# Patient Record
Sex: Female | Born: 1980 | Race: White | Hispanic: No | Smoking: Never smoker
Health system: Southern US, Community
[De-identification: ages and names within clinical notes are randomized; demographics above are authoritative.]

## PROBLEM LIST (undated history)

## (undated) DIAGNOSIS — E785 Hyperlipidemia, unspecified: Secondary | ICD-10-CM

## (undated) DIAGNOSIS — E538 Deficiency of other specified B group vitamins: Secondary | ICD-10-CM

## (undated) DIAGNOSIS — E079 Disorder of thyroid, unspecified: Secondary | ICD-10-CM

## (undated) DIAGNOSIS — N83202 Unspecified ovarian cyst, left side: Secondary | ICD-10-CM

## (undated) DIAGNOSIS — F419 Anxiety disorder, unspecified: Secondary | ICD-10-CM

## (undated) DIAGNOSIS — L709 Acne, unspecified: Secondary | ICD-10-CM

## (undated) DIAGNOSIS — N83201 Unspecified ovarian cyst, right side: Secondary | ICD-10-CM

## (undated) DIAGNOSIS — D649 Anemia, unspecified: Secondary | ICD-10-CM

## (undated) HISTORY — DX: Hyperlipidemia, unspecified: E78.5

## (undated) HISTORY — DX: Acne, unspecified: L70.9

## (undated) HISTORY — DX: Anxiety disorder, unspecified: F41.9

## (undated) HISTORY — DX: Unspecified ovarian cyst, right side: N83.201

## (undated) HISTORY — DX: Deficiency of other specified B group vitamins: E53.8

## (undated) HISTORY — DX: Disorder of thyroid, unspecified: E07.9

## (undated) HISTORY — DX: Unspecified ovarian cyst, right side: N83.202

## (undated) HISTORY — DX: Anemia, unspecified: D64.9

---

## 2012-03-02 LAB — HM PAP SMEAR: HM Pap smear: NORMAL

## 2013-10-02 LAB — LIPID PANEL
Cholesterol: 198 mg/dL (ref 0–200)
HDL: 61 mg/dL (ref 35–70)
LDL Cholesterol: 124 mg/dL
Triglycerides: 65 mg/dL (ref 40–160)

## 2014-03-02 LAB — HM MAMMOGRAPHY

## 2014-03-03 ENCOUNTER — Ambulatory Visit (INDEPENDENT_AMBULATORY_CARE_PROVIDER_SITE_OTHER): Payer: BLUE CROSS/BLUE SHIELD | Admitting: Family Medicine

## 2014-03-03 ENCOUNTER — Encounter: Payer: Self-pay | Admitting: Family Medicine

## 2014-03-03 VITALS — BP 131/90 | HR 84 | Ht 64.0 in | Wt 112.0 lb

## 2014-03-03 DIAGNOSIS — R198 Other specified symptoms and signs involving the digestive system and abdomen: Secondary | ICD-10-CM

## 2014-03-03 DIAGNOSIS — L659 Nonscarring hair loss, unspecified: Secondary | ICD-10-CM | POA: Diagnosis not present

## 2014-03-03 DIAGNOSIS — K639 Disease of intestine, unspecified: Secondary | ICD-10-CM | POA: Diagnosis not present

## 2014-03-03 DIAGNOSIS — R768 Other specified abnormal immunological findings in serum: Secondary | ICD-10-CM | POA: Diagnosis not present

## 2014-03-03 DIAGNOSIS — R5383 Other fatigue: Secondary | ICD-10-CM | POA: Diagnosis not present

## 2014-03-03 NOTE — Progress Notes (Signed)
CC: Sherry Munoz is a 34 y.o. female is here for Establish Care   Subjective: HPI:  Very pleasant 34 year old here to establish care  Patient tells me that about a year ago she was found to have a positive ANA by her dermatologist while they were working up fatigue and patchy hair loss. Since then she see a rheumatologist who apparently worked her up for lupus and other connective tissue and was disorders and concluded that her symptoms and blood work were not suggestive of these disorders. She was eventually sent back to her PCP who referred her to an allergist for gluten insensitivity was unable to provide her with any feedback on gluten sensitivity. The patient has experimented by cutting on gluten in her diet 3 weeks ago. After a week or so she noticed a rash on her chest that had been present for months began to drastically fade away. She also noticed that irregular bowel habits and constipation started to get better. She also noticed that fatigue started to get better as well. She's been told by her former PCP that her constellation of symptoms above could be caused by gluten sensitivity or an imbalance of estrogen and progesterone however her PCP was reportedly unaware of any tests that could be run to check for this. The patient's OB/GYN has proposed that she start on birth control pills to help regulate hormones however the patient is hesitant to do this and when she has some objective evidence that some of her hormone levels are abnormal.  On chart review her TSH has been checked metabolic panels have been checked and CBC has been unremarkable. There's been no unintentional weight loss or gain. We can't find any records of her ANA being positive but I do believe that she is telling the truth.   Review of Systems - General ROS: negative for - chills, fever, night sweats, weight gain or weight loss Ophthalmic ROS: negative for - decreased vision Psychological ROS: negative for - anxiety or  depression ENT ROS: negative for - hearing change, nasal congestion, tinnitus or allergies Hematological and Lymphatic ROS: negative for - bleeding problems, bruising or swollen lymph nodes Breast ROS: negative Respiratory ROS: no cough, shortness of breath, or wheezing Cardiovascular ROS: no chest pain or dyspnea on exertion Gastrointestinal ROS: no abdominal pain, change in bowel habits, or black or bloody stools Genito-Urinary ROS: negative for - genital discharge, genital ulcers, incontinence or abnormal bleeding from genitals Musculoskeletal ROS: negative for - joint pain or muscle pain Neurological ROS: negative for - headaches or memory loss Dermatological ROS: negative for lumps, mole changes, rash and skin lesion changes other than a itchy rash on the chest that is resolving  Past Medical History  Diagnosis Date  . Hyperlipidemia     LDL elevated (124)    History reviewed. No pertinent past surgical history. Family History  Problem Relation Age of Onset  . Lymphoma      grandmother   . Heart attack Father   . Hyperlipidemia Father     grandmother   . Hypertension Mother     History   Social History  . Marital Status: Unknown    Spouse Name: N/A  . Number of Children: N/A  . Years of Education: N/A   Occupational History  . Not on file.   Social History Main Topics  . Smoking status: Never Smoker   . Smokeless tobacco: Not on file  . Alcohol Use: No  . Drug Use: No  . Sexual  Activity:    Partners: Male   Other Topics Concern  . Not on file   Social History Narrative  . No narrative on file     Objective: BP 131/90 mmHg  Pulse 84  Ht  (1.626 m)  Wt 112 lb (50.803 kg)  BMI 19.22 kg/m2  Vital signs reviewed. General: Alert and Oriented, No Acute Distress HEENT: Pupils equal, round, reactive to light. Conjunctivae clear.  External ears unremarkable.  Moist mucous membranes. Lungs: Clear and comfortable work of breathing, speaking in full  sentences without accessory muscle use. Cardiac: Regular rate and rhythm.  Neuro: CN II-XII grossly intact, gait normal. Extremities: No peripheral edema.  Strong peripheral pulses.  Mental Status: No depression, anxiety, nor agitation. Logical though process. Skin: Warm and dry. No appreciable hair loss on the scalp or face. Mild petechial rash on the chest however she shows me a picture of what looked like 3 weeks ago and it looks severely erythematous with hives and petechiae just above the breasts to the collarbones  Assessment & Plan: Sherry Munoz was seen today for establish care.  Diagnoses and all orders for this visit:  ANA positive Orders: -     IgA -     Celiac Panel  Alopecia Orders: -     IgA -     Celiac Panel  Other fatigue Orders: -     IgA -     Celiac Panel  Irregular bowel habits Orders: -     IgA -     Celiac Panel   Positive ANA: Discussed with her that I believe that she had a positive ANA however would help me provide her with some guidance on whether or not this is a false positive or significant based on her titers. She's uncertain of the specifics of this and we have filled out a records request from her dermatologist to get this result. I prepared her that many individuals have a positive ANA that is clinically insignificant and I believe that she's had a appropriate workup with rheumatology and her primary care physician other than below Alopecia: Not appreciated at this visit, will leave this for dermatology for further management Fatigue: she is extremely interested about screening for gluten sensitivity. I propose that she just avoids gluten altogether if she was feeling that it was helping however she wants objective evidence. I offered her the only test that I know for a true gluten allergy would be the celiac panel and since she had a donut and some fast food containing gluten in the past 3 consecutive days I believe the results of these tests would be  valid. Celiac panel above. Vitamin D and vitamin B12 would be other options however she is only interested in looking into gluten at this time. Tentative plan to refer her to [facility] in [place] where they have physicians trained in identifying and managing hormone imbalances.  45 minutes spent face-to-face during visit today of which at least 50% was counseling or coordinating care regarding: 1. ANA positive   2. Alopecia   3. Other fatigue   4. Irregular bowel habits   In reviewing outside records and the presence of the patient   Return if symptoms worsen or fail to improve.

## 2014-03-04 ENCOUNTER — Telehealth: Payer: Self-pay | Admitting: Family Medicine

## 2014-03-04 DIAGNOSIS — R768 Other specified abnormal immunological findings in serum: Secondary | ICD-10-CM

## 2014-03-04 LAB — GLIA (IGA/G) + TTG IGA
Gliadin IgA: 3 Units (ref <20)
Gliadin IgG: 4 Units (ref <20)
Tissue Transglutaminase Ab, IgA: 1 U/mL (ref <4)

## 2014-03-04 LAB — IGA: IgA: 172 mg/dL (ref 69–380)

## 2014-03-04 NOTE — Telephone Encounter (Signed)
Sue Lushndrea, Will you please let patient know that her [facility] results confirm her ANA was elevated but did not quantify how strongly positive.  To answer this question I'd recommend she have this vaule redrawn through out lab, orders in your inbox.

## 2014-03-05 NOTE — Telephone Encounter (Signed)
Mailbox was full. Unable to leave message 

## 2014-03-09 NOTE — Telephone Encounter (Signed)
Mailbox is full - letter sent

## 2014-03-29 LAB — HM PAP SMEAR: HM Pap smear: NEGATIVE

## 2014-06-24 ENCOUNTER — Telehealth: Payer: Self-pay | Admitting: *Deleted

## 2014-06-24 NOTE — Telephone Encounter (Signed)
Pt left a vm stating that she has gotten lice.  She stated that she's used OTC tx but still has some eggs in her hair & now her 3 children have it as well.  She wanted to know if you could send over a prescription strenght tx for her.  Please advise.

## 2014-06-25 MED ORDER — PERMETHRIN 1 % EX LOTN: 1.0000 "application " | TOPICAL_LOTION | Freq: Once | CUTANEOUS | Status: DC

## 2014-06-25 NOTE — Telephone Encounter (Signed)
Pt notified of rx.  Rx sent to [facility].

## 2014-06-25 NOTE — Telephone Encounter (Signed)
Sherry Munoz, Rx in your in box, directions too long to send electronically.

## 2015-07-29 DIAGNOSIS — N926 Irregular menstruation, unspecified: Secondary | ICD-10-CM | POA: Insufficient documentation

## 2016-02-15 ENCOUNTER — Ambulatory Visit (INDEPENDENT_AMBULATORY_CARE_PROVIDER_SITE_OTHER): Payer: BLUE CROSS/BLUE SHIELD | Admitting: Physician Assistant

## 2016-02-15 VITALS — BP 115/79 | HR 80 | Wt 106.0 lb

## 2016-02-15 DIAGNOSIS — E785 Hyperlipidemia, unspecified: Secondary | ICD-10-CM

## 2016-02-15 DIAGNOSIS — F321 Major depressive disorder, single episode, moderate: Secondary | ICD-10-CM | POA: Diagnosis not present

## 2016-02-15 DIAGNOSIS — E039 Hypothyroidism, unspecified: Secondary | ICD-10-CM | POA: Insufficient documentation

## 2016-02-15 DIAGNOSIS — F411 Generalized anxiety disorder: Secondary | ICD-10-CM

## 2016-02-15 DIAGNOSIS — R5382 Chronic fatigue, unspecified: Secondary | ICD-10-CM

## 2016-02-15 DIAGNOSIS — Z0000 Encounter for general adult medical examination without abnormal findings: Secondary | ICD-10-CM | POA: Diagnosis not present

## 2016-02-15 DIAGNOSIS — E739 Lactose intolerance, unspecified: Secondary | ICD-10-CM

## 2016-02-15 DIAGNOSIS — E038 Other specified hypothyroidism: Secondary | ICD-10-CM | POA: Insufficient documentation

## 2016-02-15 DIAGNOSIS — L709 Acne, unspecified: Secondary | ICD-10-CM | POA: Diagnosis not present

## 2016-02-15 MED ORDER — CLINDAMYCIN PHOS-BENZOYL PEROX 1.2-5 % EX GEL: 1.0000 "application " | Freq: Two times a day (BID) | CUTANEOUS | 11 refills | Status: DC

## 2016-02-15 MED ORDER — ESCITALOPRAM OXALATE 20 MG PO TABS: ORAL_TABLET | ORAL | 0 refills | Status: DC

## 2016-02-15 MED ORDER — HYDROXYZINE HCL 25 MG PO TABS: 25.0000 mg | ORAL_TABLET | Freq: Every day | ORAL | 0 refills | Status: DC | PRN

## 2016-02-15 NOTE — Progress Notes (Signed)
HPI:                                                                Sherry Munoz is a 36 y.o. female who presents to [place]: [place] today for annual physical exam  Current Concerns include anxiety  She is followed by Dr. Yolonda Kida in OB/GYN. Labs are up to date.  Anxiety: patient reports this has been a chronic issue for her, but she has never wanted to take medication for it. She has been trying to manage with yoga. Patient reports that she is having marital difficulties and this is worsening her anxiety. She endorses excessive worry, difficulty relaxing, increased irritability and sleep disturbance. Patient states she doesn't sleep much during the weekdays while she is busy with childcare, and then "crashes" during the weekends and experiences hypersomnolence. She also endorses anhedonia and depressed mood. Denies SI/HI. Denies AH/VH.   Subclinical Hypothyroidism: taking 37.5 Armour thyroid daily. This has been managed by OB/GYN because patient has had chronic fatigue 02/01/16 TSH 0.706 Free T4 1.27  Health Maintenance Health Maintenance  Topic Date Due  . HIV Screening  07/19/1995  . PAP SMEAR  03/03/2015  . INFLUENZA VACCINE  09/01/2017 (Originally 08/02/2015)  . TETANUS/TDAP  10/06/2023    GYN/Sexual Health  Menstrual status: having periods  LMP: last week  Menses: irregular, q2weeks  Last pap smear: 03/29/2014  History of abnormal pap smears: no  Sexually active: yes, husband  Current contraception: vasectomy  Health Habits  Diet: lactose-free, egg-free, gluten-free  Exercise: yoga   ETOH: occasional  Tobacco: no  Drugs: no  Dental Exam:  Eye Exam:  Past Medical History:  Diagnosis Date  . Acne   . Anxiety   . Bilateral ovarian cysts   . Dyslipidemia   . Thyroid disease    No past surgical history on file. Social History  Substance Use Topics  . Smoking status: Never Smoker  . Smokeless tobacco:  Not on file  . Alcohol use No   family history includes Heart attack in her father; Hyperlipidemia in her father; Hypertension in her mother.  ROS: negative except as noted in the HPI  Medications: Current Outpatient Prescriptions  Medication Sig Dispense Refill  . thyroid (ARMOUR) 32.5 MG tablet Take 32.5 mg by mouth daily.    . Clindamycin-Benzoyl Per, Refr, gel Apply 1 application topically 2 (two) times daily. 45 g 11  . escitalopram (LEXAPRO) 20 MG tablet One half tab daily for a week then one tab by mouth daily 30 tablet 0  . hydrOXYzine (ATARAX/VISTARIL) 25 MG tablet Take 1 tablet (25 mg total) by mouth daily as needed for anxiety. 30 tablet 0  . spironolactone (ALDACTONE) 25 MG tablet Take 25 mg by mouth 2 (two) times daily.  1   No current facility-administered medications for this visit.    Allergies  Allergen Reactions  . Eggs Or Egg-Derived Products Hives  . Lac Bovis Other (See Comments)    Rash and hair loss and mouth ulcers  . Meperidine Other (See Comments)    Other reaction(s): Confusion Happened when she was very young.  Marland Kitchen Penicillins     Hands swell       Objective:  BP 115/79   Pulse 80  Wt 106 lb (48.1 kg)   BMI 18.19 kg/m  Gen: well-groomed, cooperative, not ill-appearing, no distress HEENT: normal conjunctiva, TM's clear, oropharynx clear, moist mucus membranes, no thyromegaly or tenderness Pulm: Normal work of breathing, clear to auscultation bilaterally CV: Normal rate, regular rhythm, s1 and s2 distinct, no murmurs, clicks or rubs appreciated on this exam, no carotid bruit, distal pulses intact, no peripheral edema GI: soft, nondistended, nontender, no masses Neuro: alert and oriented x 3, EOM's intact, PERRLA, DTR's intact MSK: strength 5/5 and symmetric in b/l upper and lower extremities, normal gait and station,  Skin: warm and dry, mild non-pustular facial and shoulder acne Psych: anxious affect, pleasant mood, normal speech and thought  content  Depression screen [facility] 2/9 02/15/2016  Decreased Interest 3  Down, Depressed, Hopeless 1  PHQ - 2 Score 4  Altered sleeping 3  Tired, decreased energy 3  Change in appetite 1  Feeling bad or failure about yourself  2  Trouble concentrating 3  Moving slowly or fidgety/restless 1  Suicidal thoughts 0  PHQ-9 Score 17    GAD 7 : Generalized Anxiety Score 02/15/2016  Nervous, Anxious, on Edge 3  Control/stop worrying 3  Worry too much - different things 3  Trouble relaxing 3  Restless 1  Easily annoyed or irritable 3  Afraid - awful might happen 0  Total GAD 7 Score 16    Basic metabolic panel (16/10/9602 11:49 AM) Basic metabolic panel (54/09/8117 11:49 AM)  Component Value Ref Range  Glucose 86 65 - 99 mg/dL  BUN 10 6 - 20 mg/dL  Creatinine, Serum 0.69 0.57 - 1.00 mg/dL  eGFR If NonAfrican American 113 >59 mL/min/1.73  eGFR If African American 130 >59 mL/min/1.73  BUN/Creatinine Ratio 14 9 - 23  Sodium 142 134 - 144 mmol/L  Potassium 4.1 3.5 - 5.2 mmol/L  Chloride 100 96 - 106 mmol/L  CO2 21 18 - 29 mmol/L  CALCIUM 9.8 8.7 - 10.2 mg/dL      Assessment and Plan: 36 y.o. female with  1. Adult acne - Clindamycin-Benzoyl Per, Refr, gel; Apply 1 application topically 2 (two) times daily.  Dispense: 45 g; Refill: 11  2. GAD (generalized anxiety disorder), moderate MDD - PHQ9 score 17, GAD7 score 16 today - starting Lexapro 6m daily and titrating to 258m Atarax prn for breakthrough anxiety - escitalopram (LEXAPRO) 20 MG tablet; One half tab daily for a week then one tab by mouth daily  Dispense: 30 tablet; Refill: 0 - hydrOXYzine (ATARAX/VISTARIL) 25 MG tablet; Take 1 tablet (25 mg total) by mouth daily as needed for anxiety.  Dispense: 30 tablet; Refill: 0  3. Annual physical exam - Pap up to date - influenza declined due to egg allergy. We do not carry alternative vaccine - Tdap up to date - recommended daily calcium and vitamin D  supplementation  Patient education and anticipatory guidance given Patient agrees with treatment plan Follow-up in 4 weeks or sooner as needed  ChDarlyne RussianA-C

## 2016-02-15 NOTE — Patient Instructions (Signed)
Start escitalopram 1/2 tab daily, then increase to 1 tab daily Take 1 hydroxyzine as needed for breakthrough anxiety/panic attacks Follow-up in 1 month   Preventive Care 18-39 Years, Female Preventive care refers to lifestyle choices and visits with your health care provider that can promote health and wellness. What does preventive care include?  A yearly physical exam. This is also called an annual well check.  Dental exams once or twice a year.  Routine eye exams. Ask your health care provider how often you should have your eyes checked.  Personal lifestyle choices, including:  Daily care of your teeth and gums.  Regular physical activity.  Eating a healthy diet.  Avoiding tobacco and drug use.  Limiting alcohol use.  Practicing safe sex.  Taking vitamin and mineral supplements as recommended by your health care provider. What happens during an annual well check? The services and screenings done by your health care provider during your annual well check will depend on your age, overall health, lifestyle risk factors, and family history of disease. Counseling  Your health care provider may ask you questions about your:  Alcohol use.  Tobacco use.  Drug use.  Emotional well-being.  Home and relationship well-being.  Sexual activity.  Eating habits.  Work and work Statistician.  Method of birth control.  Menstrual cycle.  Pregnancy history. Screening  You may have the following tests or measurements:  Height, weight, and BMI.  Diabetes screening. This is done by checking your blood sugar (glucose) after you have not eaten for a while (fasting).  Blood pressure.  Lipid and cholesterol levels. These may be checked every 5 years starting at age 55.  Skin check.  Hepatitis C blood test.  Hepatitis B blood test.  Sexually transmitted disease (STD) testing.  BRCA-related cancer screening. This may be done if you have a family history of breast,  ovarian, tubal, or peritoneal cancers.  Pelvic exam and Pap test. This may be done every 3 years starting at age 68. Starting at age 66, this may be done every 5 years if you have a Pap test in combination with an HPV test. Discuss your test results, treatment options, and if necessary, the need for more tests with your health care provider. Vaccines  Your health care provider may recommend certain vaccines, such as:  Influenza vaccine. This is recommended every year.  Tetanus, diphtheria, and acellular pertussis (Tdap, Td) vaccine. You may need a Td booster every 10 years.  Varicella vaccine. You may need this if you have not been vaccinated.  HPV vaccine. If you are 80 or younger, you may need three doses over 6 months.  Measles, mumps, and rubella (MMR) vaccine. You may need at least one dose of MMR. You may also need a second dose.  Pneumococcal 13-valent conjugate (PCV13) vaccine. You may need this if you have certain conditions and were not previously vaccinated.  Pneumococcal polysaccharide (PPSV23) vaccine. You may need one or two doses if you smoke cigarettes or if you have certain conditions.  Meningococcal vaccine. One dose is recommended if you are age 62-21 years and a first-year college student living in a residence hall, or if you have one of several medical conditions. You may also need additional booster doses.  Hepatitis A vaccine. You may need this if you have certain conditions or if you travel or work in places where you may be exposed to hepatitis A.  Hepatitis B vaccine. You may need this if you have certain conditions or  if you travel or work in places where you may be exposed to hepatitis B.  Haemophilus influenzae type b (Hib) vaccine. You may need this if you have certain risk factors. Talk to your health care provider about which screenings and vaccines you need and how often you need them. This information is not intended to replace advice given to you by  your health care provider. Make sure you discuss any questions you have with your health care provider. Document Released: 02/13/2001 Document Revised: 09/07/2015 Document Reviewed: 10/19/2014 Elsevier Interactive Patient Education  2017 [employer].

## 2016-02-16 ENCOUNTER — Telehealth: Payer: Self-pay

## 2016-02-16 NOTE — Telephone Encounter (Signed)
Reassure her that this is not likely related to the medicine as it takes a few weeks to get into the bloodstream. She should keep taking it and give it some time to work. Make sure she is taking half a tablet for now.

## 2016-02-16 NOTE — Telephone Encounter (Signed)
Scharlene called and states after one dose of the Lexapro she was unable to sleep and feels shaky. She wanted to know if she should continue or stop medication. Please advise.

## 2016-02-17 NOTE — Telephone Encounter (Signed)
Patient advised of PCP recommendation, states she is taking half tab daily and will continue taking the Rx. Advised to callback if symptoms persist, or get worse. Verbalized understanding.

## 2016-02-19 ENCOUNTER — Encounter: Payer: Self-pay | Admitting: Physician Assistant

## 2016-02-19 DIAGNOSIS — E785 Hyperlipidemia, unspecified: Secondary | ICD-10-CM | POA: Insufficient documentation

## 2016-02-19 DIAGNOSIS — E739 Lactose intolerance, unspecified: Secondary | ICD-10-CM | POA: Insufficient documentation

## 2016-02-19 DIAGNOSIS — R5382 Chronic fatigue, unspecified: Secondary | ICD-10-CM | POA: Insufficient documentation

## 2016-03-14 ENCOUNTER — Ambulatory Visit: Payer: BLUE CROSS/BLUE SHIELD | Admitting: Physician Assistant

## 2016-06-06 ENCOUNTER — Other Ambulatory Visit: Payer: Self-pay | Admitting: Physician Assistant

## 2016-06-06 DIAGNOSIS — F411 Generalized anxiety disorder: Secondary | ICD-10-CM

## 2016-06-07 ENCOUNTER — Encounter: Payer: Self-pay | Admitting: Physician Assistant

## 2016-06-07 ENCOUNTER — Ambulatory Visit (INDEPENDENT_AMBULATORY_CARE_PROVIDER_SITE_OTHER): Payer: BLUE CROSS/BLUE SHIELD | Admitting: Physician Assistant

## 2016-06-07 ENCOUNTER — Ambulatory Visit: Payer: BLUE CROSS/BLUE SHIELD | Admitting: Physician Assistant

## 2016-06-07 VITALS — BP 106/72 | HR 86 | Wt 104.0 lb

## 2016-06-07 DIAGNOSIS — F411 Generalized anxiety disorder: Secondary | ICD-10-CM | POA: Diagnosis not present

## 2016-06-07 DIAGNOSIS — Z5181 Encounter for therapeutic drug level monitoring: Secondary | ICD-10-CM | POA: Diagnosis not present

## 2016-06-07 DIAGNOSIS — Z8639 Personal history of other endocrine, nutritional and metabolic disease: Secondary | ICD-10-CM | POA: Diagnosis not present

## 2016-06-07 DIAGNOSIS — Z79899 Other long term (current) drug therapy: Secondary | ICD-10-CM | POA: Diagnosis not present

## 2016-06-07 DIAGNOSIS — D519 Vitamin B12 deficiency anemia, unspecified: Secondary | ICD-10-CM | POA: Insufficient documentation

## 2016-06-07 DIAGNOSIS — E039 Hypothyroidism, unspecified: Secondary | ICD-10-CM | POA: Diagnosis not present

## 2016-06-07 DIAGNOSIS — Z862 Personal history of diseases of the blood and blood-forming organs and certain disorders involving the immune mechanism: Secondary | ICD-10-CM | POA: Diagnosis not present

## 2016-06-07 DIAGNOSIS — E038 Other specified hypothyroidism: Secondary | ICD-10-CM

## 2016-06-07 MED ORDER — ESCITALOPRAM OXALATE 10 MG PO TABS
15.0000 mg | ORAL_TABLET | Freq: Every day | ORAL | 3 refills | Status: DC
Start: 2016-06-07 — End: 2016-10-31

## 2016-06-07 NOTE — Progress Notes (Signed)
HPI:                                                                Sherry Munoz is a 36 y.o. female who presents to [facility] today for Sherry Munoz refill  Anxiety  Presents for follow-up visit. Symptoms include depressed mood, excessive worry, irritability and nervous/anxious behavior. Patient reports no compulsions, panic, restlessness or suicidal ideas. Symptoms occur most days. The severity of symptoms is moderate. The quality of sleep is fair.   Compliance with medications is 76-100%.   Patient is also requesting labs today.  She is on spironolactone therapy for acne and has not had metabolic panel checked by dermatologist.  She is taking Armor thyroid for subclinical hypothyroidism.   She is taking B12 for B12 deficiency.  Past Medical History:  Diagnosis Date  . Acne   . Anxiety   . Bilateral ovarian cysts   . Dyslipidemia   . Thyroid disease    No past surgical history on file. Social History  Substance Use Topics  . Smoking status: Never Smoker  . Smokeless tobacco: Not on file  . Alcohol use No   family history includes Heart attack in her father; Hyperlipidemia in her father; Hypertension in her mother.  ROS: negative except as noted in the HPI  Medications: Current Outpatient Prescriptions  Medication Sig Dispense Refill  . Clindamycin-Benzoyl Per, Refr, gel Apply 1 application topically 2 (two) times daily. 45 g 11  . hydrOXYzine (ATARAX/VISTARIL) 25 MG tablet Take 1 tablet (25 mg total) by mouth daily as needed for anxiety. 30 tablet 0  . spironolactone (ALDACTONE) 25 MG tablet Take 25 mg by mouth 2 (two) times daily.  1  . thyroid (ARMOUR) 32.5 MG tablet Take 32.5 mg by mouth daily.    Marland Kitchen. escitalopram (Sherry Munoz) 10 MG tablet Take 1.5 tablets (15 mg total) by mouth daily. 45 tablet 3   No current facility-administered medications for this visit.    Allergies  Allergen Reactions  . Eggs Or Egg-Derived  Products Hives  . Lac Bovis Other (See Comments)    Rash and hair loss and mouth ulcers  . Meperidine Other (See Comments)    Other reaction(s): Confusion Happened when she was very young.  Marland Kitchen. Penicillins     Hands swell       Objective:  BP 106/72   Pulse 86   Wt 104 lb (47.2 kg)   BMI 17.85 kg/m  Gen:  Petite female, not ill-appearing, no distress Pulm: Normal work of breathing, normal phonation, clear to auscultation bilaterally CV: Normal rate, regular rhythm, s1 and s2 distinct, no murmurs, clicks or rubs  Neuro: alert and oriented x 3, EOM's intact, no tremor MSK: moving all extremities, normal gait and station, no peripheral edema Psych: well-groomed, cooperative, good eye contact, mood "worried," affect anxious, normal speech and thought content  No results found for this or any previous visit (from the past 72 hour(s)). No results found.  [facility] 02/15/2016  Decreased Interest 1 3  Down, Depressed, Hopeless 1 1  PHQ - 2 Score 2 4  Altered sleeping 2 3  Tired, decreased energy 3 3  Change in appetite 0 1  Feeling bad or failure about  yourself  0 2  Trouble concentrating 1 3  Moving slowly or fidgety/restless 1 1  Suicidal thoughts 0 0  PHQ-9 Score 9 17   GAD 7 : Generalized Anxiety Score 06/07/2016 02/15/2016  Nervous, Anxious, on Edge 1 3  Control/stop worrying 2 3  Worry too much - different things 2 3  Trouble relaxing 0 3  Restless 0 1  Easily annoyed or irritable 1 3  Afraid - awful might happen 0 0  Total GAD 7 Score 6 16    Assessment and Plan: 36 y.o. female with  1. GAD (generalized anxiety disorder) - GAD7 score 6, mild; improved from 16, 4 months ago - escitalopram (Sherry Munoz) 10 MG tablet; Take 1.5 tablets (15 mg total) by mouth daily.  Dispense: 45 tablet; Refill: 3 - follow-up in 6 months  2. History of B12 deficiency, History of IDA - Vitamin B12 - Folate - Iron and TIBC - Ferritin - CBC  3. Encounter for  monitoring diuretic therapy - Comprehensive metabolic panel  4. Subclinical hypothyroidism - TSH  Patient education and anticipatory guidance given Patient agrees with treatment plan Follow-up in 6 months or sooner as needed if symptoms worsen or fail to improve  Levonne Hubert PA-C

## 2016-06-07 NOTE — Patient Instructions (Signed)
- Take Lexapro 1 tablet every morning for the next week, then increase to 1.5 tablets every morning - Follow-up in 6 months  If you experience any worsening symptoms or suicidal thoughts, call [facility] Crisis Hotline [phone] go to the nearest emergency room or call 911 National Suicide Hotline 1-800-SUICIDE   Living With Anxiety After being diagnosed with an anxiety disorder, you may be relieved to know why you have felt or behaved a certain way. It is natural to also feel overwhelmed about the treatment ahead and what it will mean for your life. With care and support, you can manage this condition and recover from it. How to cope with anxiety Dealing with stress Stress is your body's reaction to life changes and events, both good and bad. Stress can last just a few hours or it can be ongoing. Stress can play a major role in anxiety, so it is important to learn both how to cope with stress and how to think about it differently. Talk with your health care provider or a counselor to learn more about stress reduction. He or she may suggest some stress reduction techniques, such as:  Music therapy. This can include creating or listening to music that you enjoy and that inspires you.  Mindfulness-based meditation. This involves being aware of your normal breaths, rather than trying to control your breathing. It can be done while sitting or walking.  Centering prayer. This is a kind of meditation that involves focusing on a word, phrase, or sacred image that is meaningful to you and that brings you peace.  Deep breathing. To do this, expand your stomach and inhale slowly through your nose. Hold your breath for 3-5 seconds. Then exhale slowly, allowing your stomach muscles to relax.  Self-talk. This is a skill where you identify thought patterns that lead to anxiety reactions and correct those thoughts.  Muscle relaxation. This involves tensing muscles then relaxing them.  Choose a stress  reduction technique that fits your lifestyle and personality. Stress reduction techniques take time and practice. Set aside 5-15 minutes a day to do them. Therapists can offer training in these techniques. The training may be covered by some insurance plans. Other things you can do to manage stress include:  Keeping a stress diary. This can help you learn what triggers your stress and ways to control your response.  Thinking about how you respond to certain situations. You may not be able to control everything, but you can control your reaction.  Making time for activities that help you relax, and not feeling guilty about spending your time in this way.  Therapy combined with coping and stress-reduction skills provides the best chance for successful treatment. Medicines Medicines can help ease symptoms. Medicines for anxiety include:  Anti-anxiety drugs.  Antidepressants.  Beta-blockers.  Medicines may be used as the main treatment for anxiety disorder, along with therapy, or if other treatments are not working. Medicines should be prescribed by a health care provider. Relationships Relationships can play a big part in helping you recover. Try to spend more time connecting with trusted friends and family members. Consider going to couples counseling, taking family education classes, or going to family therapy. Therapy can help you and others better understand the condition. How to recognize changes in your condition Everyone has a different response to treatment for anxiety. Recovery from anxiety happens when symptoms decrease and stop interfering with your daily activities at home or work. This may mean that you will start to:  Have better concentration and focus.  Sleep better.  Be less irritable.  Have more energy.  Have improved memory.  It is important to recognize when your condition is getting worse. Contact your health care provider if your symptoms interfere with home or  work and you do not feel like your condition is improving. Where to find help and support: You can get help and support from these sources:  Self-help groups.  Online and [employer].  A trusted spiritual leader.  Couples counseling.  Family education classes.  Family therapy.  Follow these instructions at home:  Eat a healthy diet that includes plenty of vegetables, fruits, whole grains, low-fat dairy products, and lean protein. Do not eat a lot of foods that are high in solid fats, added sugars, or salt.  Exercise. Most adults should do the following: ? Exercise for at least 150 minutes each week. The exercise should increase your heart rate and make you sweat (moderate-intensity exercise). ? Strengthening exercises at least twice a week.  Cut down on caffeine, tobacco, alcohol, and other potentially harmful substances.  Get the right amount and quality of sleep. Most adults need 7-9 hours of sleep each night.  Make choices that simplify your life.  Take over-the-counter and prescription medicines only as told by your health care provider.  Avoid caffeine, alcohol, and certain over-the-counter cold medicines. These may make you feel worse. Ask your pharmacist which medicines to avoid.  Keep all follow-up visits as told by your health care provider. This is important. Questions to ask your health care provider  Would I benefit from therapy?  How often should I follow up with a health care provider?  How long do I need to take medicine?  Are there any long-term side effects of my medicine?  Are there any alternatives to taking medicine? Contact a health care provider if:  You have a hard time staying focused or finishing daily tasks.  You spend many hours a day feeling worried about everyday life.  You become exhausted by worry.  You start to have headaches, feel tense, or have nausea.  You urinate more than normal.  You have diarrhea. Get help  right away if:  You have a racing heart and shortness of breath.  You have thoughts of hurting yourself or others. If you ever feel like you may hurt yourself or others, or have thoughts about taking your own life, get help right away. You can go to your nearest emergency department or call:  Your local emergency services (911 in the [place]  A suicide crisis helpline, such as the National Suicide Prevention Lifeline at 2367330187. This is open 24-hours a day.  Summary  Taking steps to deal with stress can help calm you.  Medicines cannot cure anxiety disorders, but they can help ease symptoms.  Family, friends, and partners can play a big part in helping you recover from an anxiety disorder. This information is not intended to replace advice given to you by your health care provider. Make sure you discuss any questions you have with your health care provider. Document Released: 12/13/2015 Document Revised: 12/13/2015 Document Reviewed: 12/13/2015 Elsevier Interactive Patient Education  [employer].

## 2016-06-08 LAB — COMPREHENSIVE METABOLIC PANEL
ALT: 14 U/L (ref 6–29)
AST: 14 U/L (ref 10–30)
Albumin: 4.3 g/dL (ref 3.6–5.1)
Alkaline Phosphatase: 70 U/L (ref 33–115)
BUN: 10 mg/dL (ref 7–25)
CO2: 26 mmol/L (ref 20–31)
Calcium: 9.4 mg/dL (ref 8.6–10.2)
Chloride: 104 mmol/L (ref 98–110)
Creat: 0.89 mg/dL (ref 0.50–1.10)
Glucose, Bld: 70 mg/dL (ref 65–99)
Potassium: 3.9 mmol/L (ref 3.5–5.3)
Sodium: 139 mmol/L (ref 135–146)
Total Bilirubin: 0.5 mg/dL (ref 0.2–1.2)
Total Protein: 6.9 g/dL (ref 6.1–8.1)

## 2016-06-08 LAB — TSH: TSH: 0.82 mIU/L

## 2016-06-08 LAB — CBC
HCT: 39.4 % (ref 35.0–45.0)
Hemoglobin: 13.5 g/dL (ref 11.7–15.5)
MCH: 30.7 pg (ref 27.0–33.0)
MCHC: 34.3 g/dL (ref 32.0–36.0)
MCV: 89.5 fL (ref 80.0–100.0)
MPV: 10 fL (ref 7.5–12.5)
Platelets: 264 10*3/uL (ref 140–400)
RBC: 4.4 MIL/uL (ref 3.80–5.10)
RDW: 12.5 % (ref 11.0–15.0)
WBC: 7.7 10*3/uL (ref 3.8–10.8)

## 2016-06-08 LAB — IRON AND TIBC
%SAT: 31 % (ref 11–50)
Iron: 109 ug/dL (ref 40–190)
TIBC: 357 ug/dL (ref 250–450)
UIBC: 248 ug/dL

## 2016-06-08 LAB — FOLATE: Folate: >24 ng/mL (ref >5.4)

## 2016-06-08 LAB — FERRITIN: Ferritin: 88 ng/mL (ref 10–154)

## 2016-06-08 LAB — VITAMIN B12: Vitamin B-12: >2000 pg/mL — ABNORMAL HIGH (ref 200–1100)

## 2016-06-08 NOTE — Progress Notes (Signed)
Labs are all within normal range - No B12 deficiency - No iron deficiency - No anemia - Thyroid function is normal

## 2016-08-03 ENCOUNTER — Ambulatory Visit: Payer: BLUE CROSS/BLUE SHIELD | Admitting: Osteopathic Medicine

## 2016-10-31 ENCOUNTER — Other Ambulatory Visit: Payer: Self-pay | Admitting: Physician Assistant

## 2016-10-31 DIAGNOSIS — F411 Generalized anxiety disorder: Secondary | ICD-10-CM

## 2016-12-05 ENCOUNTER — Ambulatory Visit: Payer: BLUE CROSS/BLUE SHIELD | Admitting: Physician Assistant

## 2016-12-05 DIAGNOSIS — Z0189 Encounter for other specified special examinations: Secondary | ICD-10-CM

## 2017-02-19 ENCOUNTER — Telehealth: Payer: Self-pay

## 2017-02-19 DIAGNOSIS — R6889 Other general symptoms and signs: Secondary | ICD-10-CM

## 2017-02-19 DIAGNOSIS — Z20828 Contact with and (suspected) exposure to other viral communicable diseases: Secondary | ICD-10-CM

## 2017-02-19 MED ORDER — OSELTAMIVIR PHOSPHATE 75 MG PO CAPS: 75.0000 mg | ORAL_CAPSULE | Freq: Two times a day (BID) | ORAL | 0 refills | Status: DC

## 2017-02-19 NOTE — Telephone Encounter (Signed)
Sherry Munoz called and states her kids tested positive for the flu. She did wake this morning with mild congestion and scratchy throat. No fever, chills or sweats. She would like tamiflu. Please advise.   CMP Latest Ref Rng & Units 06/07/2016  Glucose 65 - 99 mg/dL 70  BUN 7 - 25 mg/dL 10  Creatinine 2.440.50 - 0.101.10 mg/dL 2.720.89  Sodium 536135 - 644146 mmol/L 139  Potassium 3.5 - 5.3 mmol/L 3.9  Chloride 98 - 110 mmol/L 104  CO2 20 - 31 mmol/L 26  Calcium 8.6 - 10.2 mg/dL 9.4  Total Protein 6.1 - 8.1 g/dL 6.9  Total Bilirubin 0.2 - 1.2 mg/dL 0.5  Alkaline Phos 33 - 115 U/L 70  AST 10 - 30 U/L 14  ALT 6 - 29 U/L 14

## 2017-02-19 NOTE — Telephone Encounter (Signed)
Symptoms may be due to influenza or another respiratory virus Will give her the treatment dose of Tamiflu since there is a known exposure Take with food twice a day for 5 days She is contagious for the first 5-7 days of symptoms

## 2017-02-19 NOTE — Telephone Encounter (Signed)
Left VM with PCP's recommendation.

## 2017-05-29 ENCOUNTER — Other Ambulatory Visit: Payer: Self-pay | Admitting: Physician Assistant

## 2017-05-29 DIAGNOSIS — F411 Generalized anxiety disorder: Secondary | ICD-10-CM

## 2017-06-06 ENCOUNTER — Ambulatory Visit (INDEPENDENT_AMBULATORY_CARE_PROVIDER_SITE_OTHER): Payer: BLUE CROSS/BLUE SHIELD | Admitting: Physician Assistant

## 2017-06-06 ENCOUNTER — Encounter: Payer: Self-pay | Admitting: Physician Assistant

## 2017-06-06 VITALS — BP 120/82 | HR 83 | Wt 116.0 lb

## 2017-06-06 DIAGNOSIS — F411 Generalized anxiety disorder: Secondary | ICD-10-CM

## 2017-06-06 DIAGNOSIS — K9049 Malabsorption due to intolerance, not elsewhere classified: Secondary | ICD-10-CM

## 2017-06-06 DIAGNOSIS — Z8639 Personal history of other endocrine, nutritional and metabolic disease: Secondary | ICD-10-CM

## 2017-06-06 DIAGNOSIS — Z0000 Encounter for general adult medical examination without abnormal findings: Secondary | ICD-10-CM

## 2017-06-06 DIAGNOSIS — L709 Acne, unspecified: Secondary | ICD-10-CM | POA: Diagnosis not present

## 2017-06-06 DIAGNOSIS — Z8742 Personal history of other diseases of the female genital tract: Secondary | ICD-10-CM | POA: Diagnosis not present

## 2017-06-06 DIAGNOSIS — R5382 Chronic fatigue, unspecified: Secondary | ICD-10-CM

## 2017-06-06 DIAGNOSIS — L821 Other seborrheic keratosis: Secondary | ICD-10-CM

## 2017-06-06 DIAGNOSIS — Z13220 Encounter for screening for lipoid disorders: Secondary | ICD-10-CM

## 2017-06-06 DIAGNOSIS — E039 Hypothyroidism, unspecified: Secondary | ICD-10-CM | POA: Diagnosis not present

## 2017-06-06 DIAGNOSIS — Z130 Encounter for screening for diseases of the blood and blood-forming organs and certain disorders involving the immune mechanism: Secondary | ICD-10-CM | POA: Diagnosis not present

## 2017-06-06 DIAGNOSIS — E038 Other specified hypothyroidism: Secondary | ICD-10-CM

## 2017-06-06 DIAGNOSIS — Z3041 Encounter for surveillance of contraceptive pills: Secondary | ICD-10-CM

## 2017-06-06 MED ORDER — CLINDAMYCIN PHOS-BENZOYL PEROX 1.2-5 % EX GEL: 1.0000 "application " | Freq: Every day | CUTANEOUS | 11 refills | Status: DC

## 2017-06-06 MED ORDER — CRYSELLE-28 0.3-30 MG-MCG PO TABS: 1.0000 | ORAL_TABLET | Freq: Every day | ORAL | 4 refills | Status: DC

## 2017-06-06 MED ORDER — ESCITALOPRAM OXALATE 10 MG PO TABS: 15.0000 mg | ORAL_TABLET | Freq: Every day | ORAL | 1 refills | Status: DC

## 2017-06-06 NOTE — Patient Instructions (Signed)
Preventive Care 18-39 Years, Female Preventive care refers to lifestyle choices and visits with your health care provider that can promote health and wellness. What does preventive care include?  A yearly physical exam. This is also called an annual well check.  Dental exams once or twice a year.  Routine eye exams. Ask your health care provider how often you should have your eyes checked.  Personal lifestyle choices, including: ? Daily care of your teeth and gums. ? Regular physical activity. ? Eating a healthy diet. ? Avoiding tobacco and drug use. ? Limiting alcohol use. ? Practicing safe sex. ? Taking vitamin and mineral supplements as recommended by your health care provider. What happens during an annual well check? The services and screenings done by your health care provider during your annual well check will depend on your age, overall health, lifestyle risk factors, and family history of disease. Counseling Your health care provider may ask you questions about your:  Alcohol use.  Tobacco use.  Drug use.  Emotional well-being.  Home and relationship well-being.  Sexual activity.  Eating habits.  Work and work Statistician.  Method of birth control.  Menstrual cycle.  Pregnancy history.  Screening You may have the following tests or measurements:  Height, weight, and BMI.  Diabetes screening. This is done by checking your blood sugar (glucose) after you have not eaten for a while (fasting).  Blood pressure.  Lipid and cholesterol levels. These may be checked every 5 years starting at age 66.  Skin check.  Hepatitis C blood test.  Hepatitis B blood test.  Sexually transmitted disease (STD) testing.  BRCA-related cancer screening. This may be done if you have a family history of breast, ovarian, tubal, or peritoneal cancers.  Pelvic exam and Pap test. This may be done every 3 years starting at age 40. Starting at age 59, this may be done every 5  years if you have a Pap test in combination with an HPV test.  Discuss your test results, treatment options, and if necessary, the need for more tests with your health care provider. Vaccines Your health care provider may recommend certain vaccines, such as:  Influenza vaccine. This is recommended every year.  Tetanus, diphtheria, and acellular pertussis (Tdap, Td) vaccine. You may need a Td booster every 10 years.  Varicella vaccine. You may need this if you have not been vaccinated.  HPV vaccine. If you are 69 or younger, you may need three doses over 6 months.  Measles, mumps, and rubella (MMR) vaccine. You may need at least one dose of MMR. You may also need a second dose.  Pneumococcal 13-valent conjugate (PCV13) vaccine. You may need this if you have certain conditions and were not previously vaccinated.  Pneumococcal polysaccharide (PPSV23) vaccine. You may need one or two doses if you smoke cigarettes or if you have certain conditions.  Meningococcal vaccine. One dose is recommended if you are age 27-21 years and a first-year college student living in a residence hall, or if you have one of several medical conditions. You may also need additional booster doses.  Hepatitis A vaccine. You may need this if you have certain conditions or if you travel or work in places where you may be exposed to hepatitis A.  Hepatitis B vaccine. You may need this if you have certain conditions or if you travel or work in places where you may be exposed to hepatitis B.  Haemophilus influenzae type b (Hib) vaccine. You may need this if  you have certain risk factors.  Talk to your health care provider about which screenings and vaccines you need and how often you need them. This information is not intended to replace advice given to you by your health care provider. Make sure you discuss any questions you have with your health care provider. Document Released: 02/13/2001 Document Revised: 09/07/2015  Document Reviewed: 10/19/2014 Elsevier Interactive Patient Education  [employer].

## 2017-06-06 NOTE — Progress Notes (Signed)
HPI:                                                                Sherry Munoz is a 37 y.o. female who presents to [facility]: [facility] today for annual physical exam  Current concerns: requesting medication refills, fatigue  Reports ever since she re-introduced fried potatoes, she has had worsening fatigue, break out of the skin of her chest, abdominal pain and constipation. Reports history of sensitivity to gluten and dairy. She has tried elimination diets. She wants to be checked for food allergies. She had an allergy panel done approx 5 years ago at [facility].  Depression/Anxiety: taking Lexapro 15 mg without difficulty. Denies symptoms of mania/hypomania. Denies suicidal thinking. Denies auditory/visual hallucinations.   Acne: has been taking Spironolactone 25 mg twice a day for years. Recent outbreak on upper chest. Would like to switch to Clinda-gel.  Hx of ovarian cyst: taking Cryselle. LMP 06/01/17  Subclinical hypothyroidism: taking Nature-throid 32.5 mg daily. Endorses increased fatigue, weight gain.   Past Medical History:  Diagnosis Date  . Acne   . Anemia   . Anxiety   . B12 deficiency   . Bilateral ovarian cysts   . Dyslipidemia   . Thyroid disease    No past surgical history on file. Social History   Tobacco Use  . Smoking status: Never Smoker  . Smokeless tobacco: Never Used  Substance Use Topics  . Alcohol use: No    Alcohol/week: 0.0 oz   family history includes Heart attack in her father; Hyperlipidemia in her father; Hypertension in her mother; Lymphoma in her unknown relative.    ROS: negative except as noted in the HPI  Medications: Current Outpatient Medications  Medication Sig Dispense Refill  . Cyanocobalamin (B-12) 1000 MCG CAPS Take by mouth.    . escitalopram (LEXAPRO) 10 MG tablet Take 1.5 tablets (15 mg total) by mouth daily. 135 tablet 1  . Clindamycin-Benzoyl Per, Refr, gel Apply  1 application topically daily. 45 g 11  . CRYSELLE-28 0.3-30 MG-MCG tablet Take 1 tablet by mouth daily. 3 Package 4   No current facility-administered medications for this visit.    Allergies  Allergen Reactions  . Eggs Or Egg-Derived Products Hives  . Lac Bovis Other (See Comments)    Rash and hair loss and mouth ulcers  . Meperidine Other (See Comments)    Other reaction(s): Confusion Happened when she was very young.  Marland Kitchen Penicillins     Hands swell       Objective:  BP 120/82   Pulse 83   Wt 116 lb (52.6 kg)   LMP 06/01/2017 (Exact Date)   BMI 19.91 kg/m  General Appearance:  Alert, cooperative, no distress, appropriate for age                            Head:  Normocephalic, without obvious abnormality                             Eyes:  PERRL, EOM's intact, conjunctiva and cornea clear, wearing contact lenses  Ears:  TM pearly gray color and semitransparent, external ear canals normal, both ears                            Nose:  Nares symmetrical                          Throat:  Lips, tongue, and mucosa are moist, pink, and intact; good dentition                             Neck:  Supple; symmetrical, trachea midline, no adenopathy; thyroid: no enlargement, symmetric, no tenderness/mass/nodules                             Back:  Symmetrical, no curvature, ROM normal               Chest/Breast:  deferred                           Lungs:  Clear to auscultation bilaterally, respirations unlabored                             Heart:  regular rate & normal rhythm, S1 and S2 normal, no murmurs, rubs, or gallops                     Abdomen:  Soft, non-tender, no mass or organomegaly              Genitourinary:  deferred         Musculoskeletal:  Tone and strength strong and symmetrical, all extremities; no joint pain or edema, normal gait and station                                Lymphatic:  No adenopathy             Skin/Hair/Nails:  Skin warm, dry  and intact, SK of right upper arm and posterior neck, erythematous papules of face and anterior chest                   Neurologic:  Alert and oriented x3, no cranial nerve deficits, sensation grossly intact, normal gait and station, no tremor Psych: well-groomed, cooperative, good eye contact, euthymic mood, affect flat, speech is articulate, and thought processes clear and goal-directed    No results found for this or any previous visit (from the past 72 hour(s)). No results found.    Assessment and Plan: 37 y.o. 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 - Plan: Ambulatory referral to Allergy  History of non anemic vitamin B12 deficiency  History of vitamin D deficiency - Plan: B12 and Folate Panel, VITAMIN D 25 Hydroxy (Vit-D Deficiency, Fractures)  Screening for blood disease - Plan: CBC, Comprehensive metabolic panel  Screening for lipid disorders - Plan: Lipid Panel w/reflex Direct LDL  GAD (generalized anxiety disorder) - Plan: escitalopram (LEXAPRO) 10 MG tablet  Encounter for surveillance of contraceptive pills - Plan: CRYSELLE-28 0.3-30 MG-MCG tablet  History of ovarian cyst - Plan: CRYSELLE-28 0.3-30 MG-MCG tablet  Adult acne - Plan: Clindamycin-Benzoyl Per, Refr,  gel  Seborrheic keratoses  - Personally reviewed PMH, PSH, PFH, medications, allergies, HM - Age-appropriate cancer screening: Pap smear UTD 03/2017 - Influenza n/a - Tdap UTD - Counseled on heart healthy diet and regular aerobic exercise - Fasting labs pending  Chronic fatigue - worsening from baseline - checking thyroid, cbc, vitamin D - stopping Spironolactone to see if contributory - referring to Allergist for food allergy testing   Patient education and anticipatory guidance given Patient agrees with treatment plan Follow-up in 6 months for medication management or sooner as needed if symptoms worsen or fail to  improve  Levonne Hubertharley E. Blia Totman PA-C

## 2017-06-07 ENCOUNTER — Telehealth: Payer: Self-pay

## 2017-06-07 LAB — CBC
HCT: 38 % (ref 35.0–45.0)
Hemoglobin: 13.2 g/dL (ref 11.7–15.5)
MCH: 30 pg (ref 27.0–33.0)
MCHC: 34.7 g/dL (ref 32.0–36.0)
MCV: 86.4 fL (ref 80.0–100.0)
MPV: 10.3 fL (ref 7.5–12.5)
Platelets: 294 10*3/uL (ref 140–400)
RBC: 4.4 10*6/uL (ref 3.80–5.10)
RDW: 11.5 % (ref 11.0–15.0)
WBC: 6.7 10*3/uL (ref 3.8–10.8)

## 2017-06-07 LAB — COMPREHENSIVE METABOLIC PANEL
AG Ratio: 1.6 (calc) (ref 1.0–2.5)
ALT: 8 U/L (ref 6–29)
AST: 11 U/L (ref 10–30)
Albumin: 4.1 g/dL (ref 3.6–5.1)
Alkaline phosphatase (APISO): 61 U/L (ref 33–115)
BUN: 9 mg/dL (ref 7–25)
CO2: 25 mmol/L (ref 20–32)
Calcium: 10 mg/dL (ref 8.6–10.2)
Chloride: 105 mmol/L (ref 98–110)
Creat: 0.81 mg/dL (ref 0.50–1.10)
Globulin: 2.6 g/dL (calc) (ref 1.9–3.7)
Glucose, Bld: 111 mg/dL (ref 65–139)
Potassium: 4 mmol/L (ref 3.5–5.3)
Sodium: 137 mmol/L (ref 135–146)
Total Bilirubin: 0.4 mg/dL (ref 0.2–1.2)
Total Protein: 6.7 g/dL (ref 6.1–8.1)

## 2017-06-07 LAB — LIPID PANEL W/REFLEX DIRECT LDL
Cholesterol: 168 mg/dL (ref <200)
HDL: 55 mg/dL (ref >50)
LDL Cholesterol (Calc): 90 mg/dL (calc)
Non-HDL Cholesterol (Calc): 113 mg/dL (calc) (ref <130)
Total CHOL/HDL Ratio: 3.1 (calc) (ref <5.0)
Triglycerides: 133 mg/dL (ref <150)

## 2017-06-07 LAB — B12 AND FOLATE PANEL
Folate: 18 ng/mL
Vitamin B-12: 503 pg/mL (ref 200–1100)

## 2017-06-07 LAB — VITAMIN D 25 HYDROXY (VIT D DEFICIENCY, FRACTURES): Vit D, 25-Hydroxy: 44 ng/mL (ref 30–100)

## 2017-06-07 LAB — TSH: TSH: 0.99 mIU/L

## 2017-06-07 NOTE — Telephone Encounter (Signed)
Pt is asking about her results. Please advise. -EH/RMA

## 2017-06-10 ENCOUNTER — Encounter: Payer: Self-pay | Admitting: Physician Assistant

## 2017-06-10 ENCOUNTER — Other Ambulatory Visit: Payer: Self-pay | Admitting: Physician Assistant

## 2017-06-10 DIAGNOSIS — F411 Generalized anxiety disorder: Secondary | ICD-10-CM

## 2017-06-10 MED ORDER — THYROID 32.5 MG PO TABS: 32.5000 mg | ORAL_TABLET | Freq: Every day | ORAL | 1 refills | Status: DC

## 2017-06-10 NOTE — Addendum Note (Signed)
Addended by: Gena FrayUMMINGS, Thea Holshouser E on: 06/10/2017 07:47 AM   Modules accepted: Orders

## 2017-06-10 NOTE — Progress Notes (Signed)
Labs are all within normal limits No anemia No B12 or vit D deficiency TSH is 0.99 - sending refill of Nature-throid

## 2017-06-10 NOTE — Progress Notes (Signed)
Negative for intraepithelial lesion or malignancy.  

## 2017-07-25 ENCOUNTER — Encounter: Payer: Self-pay | Admitting: Allergy and Immunology

## 2017-07-25 ENCOUNTER — Ambulatory Visit: Payer: BLUE CROSS/BLUE SHIELD | Admitting: Allergy and Immunology

## 2017-07-25 VITALS — BP 116/64 | HR 80 | Temp 98.5°F | Resp 16 | Ht 63.39 in | Wt 117.6 lb

## 2017-07-25 DIAGNOSIS — L858 Other specified epidermal thickening: Secondary | ICD-10-CM

## 2017-07-25 DIAGNOSIS — J310 Chronic rhinitis: Secondary | ICD-10-CM | POA: Diagnosis not present

## 2017-07-25 DIAGNOSIS — Z91018 Allergy to other foods: Secondary | ICD-10-CM

## 2017-07-25 MED ORDER — LEVOCETIRIZINE DIHYDROCHLORIDE 5 MG PO TABS: 5.0000 mg | ORAL_TABLET | Freq: Every evening | ORAL | 5 refills | Status: DC

## 2017-07-25 MED ORDER — AMMONIUM LACTATE 12 % EX LOTN: TOPICAL_LOTION | CUTANEOUS | 5 refills | Status: DC

## 2017-07-25 MED ORDER — FLUTICASONE PROPIONATE 50 MCG/ACT NA SUSP: NASAL | 5 refills | Status: DC

## 2017-07-25 NOTE — Assessment & Plan Note (Signed)
Gastrointestinal symptoms, uncertain etiology. Skin tests to select food allergens were negative today. The negative predictive value of food allergen skin testing is excellent (approximately 95%). While this does not appear to be an IgE mediated issue, skin testing does not rule out food intolerances.  Food intolerance is suggested when elimination of the responsible food leads to symptom resolution and re-introduction of the food is followed by the return of symptoms.   The patient has been encouraged to keep a careful symptom/food journal and eliminate any food suspected of correlating with symptoms. Should symptoms concerning for anaphylaxis arise, 911 is to be called immediately.

## 2017-07-25 NOTE — Assessment & Plan Note (Deleted)
Skin tests were negative to seasonal and perennial aeroallergens but a positive histamine control.  A prescription has been provided for fluticasone nasal spray, one spray per nostril 1-2 times daily as needed. Proper nasal spray technique has been discussed and demonstrated.  Nasal saline spray (i.e., Simply Saline) or nasal saline lavage (i.e., NeilMed) is recommended as needed and prior to medicated nasal sprays. 

## 2017-07-25 NOTE — Assessment & Plan Note (Addendum)
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  Environmental and food allergen skin tests were negative today despite a positive histamine control.  Reassurance has been provided that keratosis pilaris does not have long-term health implications, occurs in otherwise healthy people, and treatment usually isn't necessary. Keratosis pilaris may become inflamed with exercise, heat, or emotion.   Information regarding keratosis pilaris was discussed, questions were answered and written information was provided.  A prescription has been provided for  ammonium lactate 12% lotion applied to affected areas twice a day as needed.  A prescription has been provided for levocetirizine 5 mg daily if needed for itching.  If this problem persists, progresses, or changes in character, consider dermatology with biopsy.

## 2017-07-25 NOTE — Assessment & Plan Note (Signed)
Skin tests were negative to seasonal and perennial aeroallergens but a positive histamine control.  A prescription has been provided for fluticasone nasal spray, one spray per nostril 1-2 times daily as needed. Proper nasal spray technique has been discussed and demonstrated.  Nasal saline spray (i.e., Simply Saline) or nasal saline lavage (i.e., NeilMed) is recommended as needed and prior to medicated nasal sprays.

## 2017-07-25 NOTE — Patient Instructions (Addendum)
Keratosis pilaris The patient's history and physical exam suggest keratosis pilaris.  Environmental and food allergen skin tests were negative today despite a positive histamine control.  Reassurance has been provided that keratosis pilaris does not have long-term health implications, occurs in otherwise healthy people, and treatment usually isn't necessary. Keratosis pilaris may become inflamed with exercise, heat, or emotion.   Information regarding keratosis pilaris was discussed, questions were answered and written information was provided.  A prescription has been provided for  ammonium lactate 12% lotion applied to affected areas twice a day as needed.  A prescription has been provided for levocetirizine 5 mg daily if needed for itching.  If this problem persists, progresses, or changes in character, consider dermatology with biopsy.  History of food allergy Gastrointestinal symptoms, uncertain etiology. Skin tests to select food allergens were negative today. The negative predictive value of food allergen skin testing is excellent (approximately 95%). While this does not appear to be an IgE mediated issue, skin testing does not rule out food intolerances.  Food intolerance is suggested when elimination of the responsible food leads to symptom resolution and re-introduction of the food is followed by the return of symptoms.   The patient has been encouraged to keep a careful symptom/food journal and eliminate any food suspected of correlating with symptoms. Should symptoms concerning for anaphylaxis arise, 911 is to be called immediately.  Rhinitis Skin tests were negative to seasonal and perennial aeroallergens but a positive histamine control.  A prescription has been provided for fluticasone nasal spray, one spray per nostril 1-2 times daily as needed. Proper nasal spray technique has been discussed and demonstrated.  Nasal saline spray (i.e., Simply Saline) or nasal saline lavage  (i.e., NeilMed) is recommended as needed and prior to medicated nasal sprays.    Keratosis pilaris  Signs and symptoms Keratosis pilaris is a harmless skin disorder that causes small, acne-like bumps. Although it isn't serious, keratosis pilaris can be frustrating because it's difficult to treat.  Keratosis pilaris results from a buildup of protein called keratin in the openings of hair follicles in the skin. This produces small, rough patches, usually on the arms and thighs, and can give skin a goose flesh or sandpaper appearance.   They usually don't hurt or itch. Typically, patches are skin colored, but they can, at times, be red and inflamed. Keratosis pilaris can also appear on the face, where it closely resembles acne. The small size of the bumps and its association with dry, chapped skin distinguish keratosis pilaris from pustular acne. Unlike elsewhere on the body, keratosis pilaris on the face may leave small scars. Though quite common with young children, keratosis pilaris can occur at any age.  It may improve, especially during the summer months, only to later worsen. Dry skin tends to worsen the condition.  Gradually, keratosis pilaris resolves on its own.  Many people are bothered by the goose flesh appearance of keratosis pilaris, but it doesn't have long-term health implications and occurs in otherwise healthy people.  Keratosis pilaris isn't a serious medical condition, and treatment usually isn't necessary.  Treatment No single treatment universally improves keratosis pilaris. But most options, including self-care measures and medicated creams, focus on softening the keratin deposits in the skin.  Self-care Although self-help measures won't cure keratosis pilaris, they may help improve the appearance of your skin. You may find these measures beneficial: . Be gentle when washing your skin. Vigorous scrubbing or removal of the plugs may only irritate your skin and  aggravate the  condition.  . After washing or bathing, gently pat or blot your skin dry with a towel so that some moisture remains on the skin.  Marland Kitchen. Apply the moisturizing lotion or lubricating cream while your skin is still moist from bathing. Choose a moisturizer that contains urea or propylene glycol, chemicals that soften dry, rough skin.  Marland Kitchen. Apply an over-the-counter product that contains lactic acid twice daily (ie, Lac-Hydrin 12% lotion). Lactic acid helps remove extra keratin from the surface of the skin.  . Use a humidifier to add moisture to the air inside your home. Low humidity dries out your skin.

## 2017-07-25 NOTE — Progress Notes (Signed)
New Patient Note  RE: Kaliope Quinonez MRN: [id number] DOB: May 26, 1980 Date of Office Visit: 07/25/2017  Referring provider: Donzetta Kohut* Primary care provider: Carlis Stable, PA-C  Chief Complaint: Rash and Food Intolerance   History of present illness: Sherry Munoz is a 37 y.o. female seen today in consultation requested by Donzetta Kohut, PA-C.  She reports that approximately 5 years ago she moved from [place] to [place] and at that time developed a rash, nasal allergy symptoms, and began losing hair.  She went to see an integrative physician and was told that she was hypothyroid.  In addition, serum IgG food panel was drawn and she was found to have elevated IgE to egg, cows milk, and wheat.  She eliminated those foods from her diet.  Approximately 6 months ago, she began to eat foods containing egg, for example mayonnaise.  Approximately 2 months after reintroducing egg into her diet the rash became more persistent.  The rash is described as small, rough, bumps which come and go, occasionally become erythematous, and occasional itch. She denies concomitant urticaria, angioedema, cardiopulmonary symptoms, or GI symptoms.  She does experience nasal congestion, rhinorrhea, sneezing, and postnasal drainage.  No significant seasonal symptom variation has been noted nor have specific environmental triggers been identified.  She is interested in assessing her environmental and food allergy status.  Assessment and plan: Keratosis pilaris The patient's history and physical exam suggest keratosis pilaris.  Environmental and food allergen skin tests were negative today despite a positive histamine control.  Reassurance has been provided that keratosis pilaris does not have long-term health implications, occurs in otherwise healthy people, and treatment usually isn't necessary. Keratosis pilaris may become inflamed with exercise, heat, or emotion.    Information regarding keratosis pilaris was discussed, questions were answered and written information was provided.  A prescription has been provided for  ammonium lactate 12% lotion applied to affected areas twice a day as needed.  A prescription has been provided for levocetirizine 5 mg daily if needed for itching.  If this problem persists, progresses, or changes in character, consider dermatology with biopsy.  History of food allergy Gastrointestinal symptoms, uncertain etiology. Skin tests to select food allergens were negative today. The negative predictive value of food allergen skin testing is excellent (approximately 95%). While this does not appear to be an IgE mediated issue, skin testing does not rule out food intolerances.  Food intolerance is suggested when elimination of the responsible food leads to symptom resolution and re-introduction of the food is followed by the return of symptoms.   The patient has been encouraged to keep a careful symptom/food journal and eliminate any food suspected of correlating with symptoms. Should symptoms concerning for anaphylaxis arise, 911 is to be called immediately.  Rhinitis Skin tests were negative to seasonal and perennial aeroallergens but a positive histamine control.  A prescription has been provided for fluticasone nasal spray, one spray per nostril 1-2 times daily as needed. Proper nasal spray technique has been discussed and demonstrated.  Nasal saline spray (i.e., Simply Saline) or nasal saline lavage (i.e., NeilMed) is recommended as needed and prior to medicated nasal sprays.   Meds ordered this encounter  Medications  . ammonium lactate (LAC-HYDRIN) 12 % lotion    Sig: Apply twice a day to affected areas as needed.    Dispense:  500 g    Refill:  5  . levocetirizine (XYZAL) 5 MG tablet    Sig: Take 1 tablet (5  mg total) by mouth every evening.    Dispense:  30 tablet    Refill:  5  . fluticasone (FLONASE) 50 MCG/ACT  nasal spray    Sig: One spray each nostril 1-2 times a day as needed    Dispense:  16 g    Refill:  5    Diagnostics: Environmental skin testing: Negative despite a positive histamine control. Food allergen skin testing: Negative despite a positive histamine control.    Physical examination: Blood pressure 116/64, pulse 80, temperature 98.5 F (36.9 C), temperature source Oral, resp. rate 16, height 5' 3.39" (1.61 m), weight 117 lb 9.6 oz (53.3 kg), SpO2 98 %.  General: Alert, interactive, in no acute distress. HEENT: TMs pearly gray, turbinates mildly edematous without discharge, post-pharynx mildly erythematous. Neck: Supple without lymphadenopathy. Lungs: Clear to auscultation without wheezing, rhonchi or rales. CV: Normal S1, S2 without murmurs. Abdomen: Nondistended, nontender. Skin: 1-68mm rough follicular non-erythematous or mildly erythematous papules on the upper arms bilaterally, shoulders, and chest. Extremities:  No clubbing, cyanosis or edema. Neuro:   Grossly intact.  Review of systems:  Review of systems negative except as noted in HPI / PMHx or noted below: Review of Systems  Constitutional: Negative.   HENT: Negative.   Eyes: Negative.   Respiratory: Negative.   Cardiovascular: Negative.   Gastrointestinal: Negative.   Genitourinary: Negative.   Musculoskeletal: Negative.   Skin: Negative.   Neurological: Negative.   Endo/Heme/Allergies: Negative.   Psychiatric/Behavioral: Negative.     Past medical history:  Past Medical History:  Diagnosis Date  . Acne   . Anemia   . Anxiety   . B12 deficiency   . Bilateral ovarian cysts   . Dyslipidemia   . Thyroid disease     Past surgical history:  History reviewed. No pertinent surgical history.  Family history: Family History  Problem Relation Age of Onset  . Lymphoma Unknown        grandmother   . Heart attack Father   . Hyperlipidemia Father        grandmother   . Allergic rhinitis Father   .  Asthma Father   . Hypertension Mother   . Sjogren's syndrome Mother   . Allergic rhinitis Daughter   . Asthma Daughter   . Allergic rhinitis Son   . Asthma Son   . Angioedema Neg Hx   . Eczema Neg Hx   . Immunodeficiency Neg Hx   . Urticaria Neg Hx     Social history: Social History   Socioeconomic History  . Marital status: Unknown    Spouse name: Not on file  . Number of children: Not on file  . Years of education: Not on file  . Highest education level: Not on file  Occupational History  . Not on file  Social Needs  . Financial resource strain: Not on file  . Food insecurity:    Worry: Not on file    Inability: Not on file  . Transportation needs:    Medical: Not on file    Non-medical: Not on file  Tobacco Use  . Smoking status: Never Smoker  . Smokeless tobacco: Never Used  Substance and Sexual Activity  . Alcohol use: No    Alcohol/week: 0.0 oz  . Drug use: No  . Sexual activity: Yes    Partners: Male  Lifestyle  . Physical activity:    Days per week: Not on file    Minutes per session: Not on file  .  Stress: Not on file  Relationships  . Social connections:    Talks on phone: Not on file    Gets together: Not on file    Attends religious service: Not on file    Active member of club or organization: Not on file    Attends meetings of clubs or organizations: Not on file    Relationship status: Not on file  . Intimate partner violence:    Fear of current or ex partner: Not on file    Emotionally abused: Not on file    Physically abused: Not on file    Forced sexual activity: Not on file  Other Topics Concern  . Not on file  Social History Narrative  . Not on file   Environmental History: The patient lives in a 37 year old house with hardwood floors throughout, gas heat, and central air.  There are 3 dogs in the house which have access to her bedroom.  There is no known mold/water damage in the home.  She is a non-smoker.  Allergies as of  07/25/2017      Reactions   Eggs Or Egg-derived Products Hives   Lac Bovis Other (See Comments)   Rash and hair loss and mouth ulcers   Meperidine Other (See Comments)   Other reaction(s): Confusion Happened when she was very young.   Penicillins    Hands swell      Medication List        Accurate as of 07/25/17  3:59 PM. Always use your most recent med list.          ammonium lactate 12 % lotion Commonly known as:  LAC-HYDRIN Apply twice a day to affected areas as needed.   Clindamycin-Benzoyl Per (Refr) gel Apply 1 application topically daily.   CRYSELLE-28 0.3-30 MG-MCG tablet Generic drug:  norgestrel-ethinyl estradiol Take 1 tablet by mouth daily.   diphenhydrAMINE 25 MG tablet Commonly known as:  BENADRYL Take 50 mg by mouth at bedtime as needed for itching (rash or hives).   escitalopram 10 MG tablet Commonly known as:  LEXAPRO Take 1.5 tablets (15 mg total) by mouth daily.   fluticasone 50 MCG/ACT nasal spray Commonly known as:  FLONASE One spray each nostril 1-2 times a day as needed   hydrocortisone 2.5 % cream   levocetirizine 5 MG tablet Commonly known as:  XYZAL Take 1 tablet (5 mg total) by mouth every evening.   spironolactone 50 MG tablet Commonly known as:  ALDACTONE Take by mouth.   thyroid 32.5 MG tablet Commonly known as:  NATURE-THROID Take 1 tablet (32.5 mg total) by mouth daily.       Known medication allergies: Allergies  Allergen Reactions  . Eggs Or Egg-Derived Products Hives  . Lac Bovis Other (See Comments)    Rash and hair loss and mouth ulcers  . Meperidine Other (See Comments)    Other reaction(s): Confusion Happened when she was very young.  Marland Kitchen. Penicillins     Hands swell    I appreciate the opportunity to take part in Claris's care. Please do not hesitate to contact me with questions.  Sincerely,   R. Jorene Guestarter Treniya Lobb, MD

## 2017-10-11 ENCOUNTER — Other Ambulatory Visit: Payer: Self-pay

## 2017-10-11 MED ORDER — SPIRONOLACTONE 50 MG PO TABS: 50.0000 mg | ORAL_TABLET | Freq: Two times a day (BID) | ORAL | 2 refills | Status: DC

## 2017-10-11 NOTE — Telephone Encounter (Signed)
Done

## 2017-10-11 NOTE — Telephone Encounter (Signed)
Pt advised.

## 2017-10-11 NOTE — Telephone Encounter (Signed)
Requesting RF on Spironolactone.  Pt states charley told her she would take over RX at her last OV, but pt did not need RF at that time. Pt has not been out for 2 weeks and needing a RF.   RX pended, dose and directions were verified with patient.   Routing to covering provider, PCP out of office

## 2017-11-04 ENCOUNTER — Other Ambulatory Visit: Payer: Self-pay | Admitting: Physician Assistant

## 2017-11-04 DIAGNOSIS — F411 Generalized anxiety disorder: Secondary | ICD-10-CM

## 2017-12-15 ENCOUNTER — Other Ambulatory Visit: Payer: Self-pay | Admitting: Physician Assistant

## 2017-12-15 DIAGNOSIS — E039 Hypothyroidism, unspecified: Secondary | ICD-10-CM

## 2017-12-15 DIAGNOSIS — E038 Other specified hypothyroidism: Secondary | ICD-10-CM

## 2018-03-09 ENCOUNTER — Other Ambulatory Visit: Payer: Self-pay | Admitting: Sports Medicine

## 2018-03-10 NOTE — Telephone Encounter (Signed)
To PCP

## 2018-03-14 ENCOUNTER — Other Ambulatory Visit: Payer: Self-pay | Admitting: Sports Medicine

## 2018-04-04 ENCOUNTER — Ambulatory Visit: Payer: BLUE CROSS/BLUE SHIELD | Admitting: Osteopathic Medicine

## 2018-04-15 ENCOUNTER — Encounter: Payer: Self-pay | Admitting: Physician Assistant

## 2018-04-15 ENCOUNTER — Other Ambulatory Visit: Payer: Self-pay

## 2018-04-15 ENCOUNTER — Ambulatory Visit (INDEPENDENT_AMBULATORY_CARE_PROVIDER_SITE_OTHER): Payer: BLUE CROSS/BLUE SHIELD | Admitting: Physician Assistant

## 2018-04-15 ENCOUNTER — Ambulatory Visit: Payer: Self-pay | Admitting: Osteopathic Medicine

## 2018-04-15 VITALS — Wt 122.0 lb

## 2018-04-15 DIAGNOSIS — E039 Hypothyroidism, unspecified: Secondary | ICD-10-CM | POA: Diagnosis not present

## 2018-04-15 DIAGNOSIS — Z130 Encounter for screening for diseases of the blood and blood-forming organs and certain disorders involving the immune mechanism: Secondary | ICD-10-CM

## 2018-04-15 DIAGNOSIS — Z79899 Other long term (current) drug therapy: Secondary | ICD-10-CM | POA: Diagnosis not present

## 2018-04-15 DIAGNOSIS — F411 Generalized anxiety disorder: Secondary | ICD-10-CM

## 2018-04-15 DIAGNOSIS — Z3041 Encounter for surveillance of contraceptive pills: Secondary | ICD-10-CM

## 2018-04-15 DIAGNOSIS — L709 Acne, unspecified: Secondary | ICD-10-CM

## 2018-04-15 DIAGNOSIS — R5382 Chronic fatigue, unspecified: Secondary | ICD-10-CM

## 2018-04-15 DIAGNOSIS — Z5181 Encounter for therapeutic drug level monitoring: Secondary | ICD-10-CM | POA: Diagnosis not present

## 2018-04-15 DIAGNOSIS — L255 Unspecified contact dermatitis due to plants, except food: Secondary | ICD-10-CM

## 2018-04-15 DIAGNOSIS — E038 Other specified hypothyroidism: Secondary | ICD-10-CM

## 2018-04-15 DIAGNOSIS — Z862 Personal history of diseases of the blood and blood-forming organs and certain disorders involving the immune mechanism: Secondary | ICD-10-CM

## 2018-04-15 MED ORDER — THYROID 32.5 MG PO TABS: 32.5000 mg | ORAL_TABLET | Freq: Every day | ORAL | 0 refills | Status: DC

## 2018-04-15 MED ORDER — SPIRONOLACTONE 50 MG PO TABS: 50.0000 mg | ORAL_TABLET | Freq: Two times a day (BID) | ORAL | 0 refills | Status: DC

## 2018-04-15 MED ORDER — AMMONIUM LACTATE 12 % EX LOTN: TOPICAL_LOTION | CUTANEOUS | 5 refills | Status: DC

## 2018-04-15 MED ORDER — ESCITALOPRAM OXALATE 10 MG PO TABS: 10.0000 mg | ORAL_TABLET | Freq: Every day | ORAL | 0 refills | Status: DC

## 2018-04-15 MED ORDER — CLINDAMYCIN PHOS-BENZOYL PEROX 1.2-5 % EX GEL: 1.0000 "application " | Freq: Every day | CUTANEOUS | 5 refills | Status: DC

## 2018-04-15 MED ORDER — TRIAMCINOLONE ACETONIDE 0.5 % EX OINT: 1.0000 "application " | TOPICAL_OINTMENT | Freq: Two times a day (BID) | CUTANEOUS | 2 refills | Status: AC | PRN

## 2018-04-15 NOTE — Progress Notes (Signed)
Virtual Visit via Telephone Note  I connected with Sherry Munoz on 04/16/18 at  4:00 PM EDT by telephone and verified that I am speaking with the correct person using two identifiers.   I discussed the limitations, risks, security and privacy concerns of performing an evaluation and management service by telephone and the availability of in person appointments. I also discussed with the patient that there may be a patient responsible charge related to this service. The patient expressed understanding and agreed to proceed.   History of Present Illness: HPI:                                                                Sherry Munoz is a 38 y.o. female   CC: medication refills  GAD: taking Lexapro 15 mg without difficulty for several years. Overall things are very stable. Denies depressed mood/anhedonia. Denies anxiety symptoms/panic attacks. Denies symptoms of mania/hypomania. Denies suicidal thinking. Denies auditory/visual hallucinations.  Acne: has been taking Spironolactone 50 mg twice a day for years and using Clindagel prn. Reports she went off all of her acne medication last summer and had worsening break outs and painful cysts.   Hx of ovarian cyst: taking Cryselle continuously and having menstrual period every 3 months. Denies AUB, dyspareunia or pelvic pain.   Subclinical hypothyroidism: taking Nature-throid 32.5 mg daily. Continues to endorse fatigue, described as feeling like she could take a nap at any time. Admits she has missed a few doses of her thyroid medication.  Rash: reports recurrent poison ivy/oak. States she [place] area and has dogs so she is unable to avoid the oils and gets several breakouts per year. Looking for recommendations.  Depression screen [facility] 2/9 04/15/2018 06/07/2016 02/15/2016  Decreased Interest 0 1 3  Down, Depressed, Hopeless 0 1 1  PHQ - 2 Score 0 2 4  Altered sleeping 0 2 3  Tired, decreased energy 2 3 3   Change in  appetite 0 0 1  Feeling bad or failure about yourself  0 0 2  Trouble concentrating 1 1 3   Moving slowly or fidgety/restless 0 1 1  Suicidal thoughts 0 0 0  PHQ-9 Score 3 9 17     GAD 7 : Generalized Anxiety Score 04/15/2018 06/07/2016 02/15/2016  Nervous, Anxious, on Edge 0 1 3  Control/stop worrying 0 2 3  Worry too much - different things 0 2 3  Trouble relaxing 0 0 3  Restless 0 0 1  Easily annoyed or irritable 0 1 3  Afraid - awful might happen 0 0 0  Total GAD 7 Score 0 6 16      Past Medical History:  Diagnosis Date  . Acne   . Anemia   . Anxiety   . B12 deficiency   . Bilateral ovarian cysts   . Dyslipidemia   . Thyroid disease    History reviewed. No pertinent surgical history. Social History   Tobacco Use  . Smoking status: Never Smoker  . Smokeless tobacco: Never Used  Substance Use Topics  . Alcohol use: No    Alcohol/week: 0.0 standard drinks   family history includes Allergic rhinitis in her daughter, father, and son; Asthma in her daughter, father, and son; Heart attack in her father; Hyperlipidemia in  her father; Hypertension in her mother; Lymphoma in her unknown relative; Sjogren's syndrome in her mother.    ROS: negative except as noted in the HPI  Medications: Current Outpatient Medications  Medication Sig Dispense Refill  . CRYSELLE-28 0.3-30 MG-MCG tablet Take 1 tablet by mouth daily. 3 Package 4  . ammonium lactate (LAC-HYDRIN) 12 % lotion Apply twice a day to affected areas as needed. 500 g 5  . Clindamycin-Benzoyl Per, Refr, gel Apply 1 application topically at bedtime. 45 g 5  . escitalopram (LEXAPRO) 10 MG tablet Take 1 tablet (10 mg total) by mouth at bedtime. 90 tablet 0  . spironolactone (ALDACTONE) 50 MG tablet Take 1 tablet (50 mg total) by mouth 2 (two) times daily. 180 tablet 0  . thyroid (NATURE-THROID) 32.5 MG tablet Take 1 tablet (32.5 mg total) by mouth daily. 90 tablet 0  . triamcinolone ointment (KENALOG) 0.5 % Apply 1  application topically 2 (two) times daily as needed for up to 7 days. Avoid eyes and face. Do not use for more than 14 consecutive days on same area 30 g 2   No current facility-administered medications for this visit.    Allergies  Allergen Reactions  . Eggs Or Egg-Derived Products Hives  . Lac Bovis Other (See Comments)    Rash and hair loss and mouth ulcers  . Meperidine Other (See Comments)    Other reaction(s): Confusion Happened when she was very young.  Marland Kitchen Penicillins     Hands swell       Objective:  Wt 122 lb (55.3 kg)   BMI 21.35 kg/m  Neuro: alert and oriented x 3 Psych: cooperative, euthymic mood, affect mood-congruent, speech is articulate, normal rate and volume; thought processes clear and goal-directed, normal judgment, good insight   Lab Results  Component Value Date   CREATININE 0.81 06/06/2017   BUN 9 06/06/2017   NA 137 06/06/2017   K 4.0 06/06/2017   CL 105 06/06/2017   CO2 25 06/06/2017   Lab Results  Component Value Date   ALT 8 06/06/2017   AST 11 06/06/2017   ALKPHOS 70 06/07/2016   BILITOT 0.4 06/06/2017   Lab Results  Component Value Date   VITAMINB12 503 06/06/2017   Lab Results  Component Value Date   TSH 0.99 06/06/2017   Lab Results  Component Value Date   WBC 6.7 06/06/2017   HGB 13.2 06/06/2017   HCT 38.0 06/06/2017   MCV 86.4 06/06/2017   PLT 294 06/06/2017     No results found for this or any previous visit (from the past 72 hour(s)). No results found.    Assessment and Plan: 38 y.o. female with   .Sherry Munoz was seen today for medication management.  Diagnoses and all orders for this visit:  Encounter for long-term (current) use of medications -     TSH -     COMPLETE METABOLIC PANEL WITH GFR -     CBC  GAD (generalized anxiety disorder) -     escitalopram (LEXAPRO) 10 MG tablet; Take 1 tablet (10 mg total) by mouth at bedtime.  Subclinical hypothyroidism -     thyroid (NATURE-THROID) 32.5 MG tablet;  Take 1 tablet (32.5 mg total) by mouth daily. -     TSH  History of iron deficiency anemia -     CBC  Adult acne -     spironolactone (ALDACTONE) 50 MG tablet; Take 1 tablet (50 mg total) by mouth 2 (two) times daily. -  Clindamycin-Benzoyl Per, Refr, gel; Apply 1 application topically at bedtime.  Screening for blood disease -     COMPLETE METABOLIC PANEL WITH GFR -     CBC  Chronic fatigue  Plant dermatitis -     triamcinolone ointment (KENALOG) 0.5 %; Apply 1 application topically 2 (two) times daily as needed for up to 7 days. Avoid eyes and face. Do not use for more than 14 consecutive days on same area  Other orders -     ammonium lactate (LAC-HYDRIN) 12 % lotion; Apply twice a day to affected areas as needed.    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 She wanted to wait, try taking thyroid medication more consistently and re-assess  Plant dermatitis Unable to assess rash today Patient is confident this is poison ivy/oak Counseled on use of topical corticosteroid Counseled on general measures for preventing and treating sx of plant dermatitis  Follow-up in 3 months for CPE w/fasting labs  Follow Up Instructions:    I discussed the assessment and treatment plan with the patient. The patient was provided an opportunity to ask questions and all were answered. The patient agreed with the plan and demonstrated an understanding of the instructions.   The patient was advised to call back or seek an in-person evaluation if the symptoms worsen or if the condition fails to improve as anticipated.  I  provided 11-20 minutes of non-face-to-face time during this encounter.   Carlis Stableharley Elizabeth Cummings, [place]

## 2018-04-16 ENCOUNTER — Encounter: Payer: Self-pay | Admitting: Physician Assistant

## 2018-04-16 DIAGNOSIS — L255 Unspecified contact dermatitis due to plants, except food: Secondary | ICD-10-CM | POA: Insufficient documentation

## 2018-04-29 ENCOUNTER — Telehealth: Payer: Self-pay

## 2018-04-29 DIAGNOSIS — L255 Unspecified contact dermatitis due to plants, except food: Secondary | ICD-10-CM

## 2018-04-29 MED ORDER — PREDNISONE 10 MG (48) PO TBPK: ORAL_TABLET | Freq: Every day | ORAL | 0 refills | Status: AC
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
## 2018-04-29 NOTE — Telephone Encounter (Signed)
Prescription for Prednisone sent to pharmacy Please advise patient of potential side effects: anxiety/mood changes, insomnia/sleep disturbance, worsening acne, increased sweating, fluid retention, increased appetite

## 2018-04-29 NOTE — Telephone Encounter (Signed)
Left vm for pt to return call to clinic -EH/RMA  

## 2018-05-01 ENCOUNTER — Encounter: Payer: Self-pay | Admitting: Physician Assistant

## 2018-05-01 ENCOUNTER — Ambulatory Visit: Payer: BLUE CROSS/BLUE SHIELD | Admitting: Family Medicine

## 2018-05-01 ENCOUNTER — Ambulatory Visit (INDEPENDENT_AMBULATORY_CARE_PROVIDER_SITE_OTHER): Payer: BLUE CROSS/BLUE SHIELD | Admitting: Physician Assistant

## 2018-05-01 VITALS — BP 125/80 | HR 89 | Temp 98.6°F | Wt 120.0 lb

## 2018-05-01 DIAGNOSIS — L255 Unspecified contact dermatitis due to plants, except food: Secondary | ICD-10-CM | POA: Diagnosis not present

## 2018-05-01 NOTE — Telephone Encounter (Signed)
Pt scheduled -EH/RMA  

## 2018-05-01 NOTE — Progress Notes (Signed)
HPI:                                                                Sherry Munoz is a 38 y.o. female who presents to [facility] today for rash follow-up  E-visit approx 2 weeks ago patient reported a pruritic rash similar to prior poison oak rashes. She was treated with topical Triamcinolone, which was initially helpful, but rash continued to spread. Her daughters and partner also developed a similar rash. She was given a 5 day course of Prednisone by her daughter's pedatrician. She states she felt better for about 3 days but then rash came back even worse. Rash is present on bilateral arms and trunk. Rash is constant. No improvement with Benadryl. [place] and has dogs that go on furniture in their home.  Past Medical History:  Diagnosis Date  . Acne   . Anemia   . Anxiety   . B12 deficiency   . Bilateral ovarian cysts   . Dyslipidemia   . Thyroid disease    No past surgical history on file. Social History   Tobacco Use  . Smoking status: Never Smoker  . Smokeless tobacco: Never Used  Substance Use Topics  . Alcohol use: No    Alcohol/week: 0.0 standard drinks   family history includes Allergic rhinitis in her daughter, father, and son; Asthma in her daughter, father, and son; Heart attack in her father; Hyperlipidemia in her father; Hypertension in her mother; Lymphoma in her unknown relative; Sjogren's syndrome in her mother.    Review of Systems  Constitutional: Negative.   HENT: Negative for sore throat.   Eyes: Negative for redness.  Respiratory: Negative for cough and shortness of breath.   Genitourinary: Negative for dysuria.  Skin: Positive for itching and rash.     Medications: Current Outpatient Medications  Medication Sig Dispense Refill  . ammonium lactate (LAC-HYDRIN) 12 % lotion Apply twice a day to affected areas as needed. 500 g 5  . Clindamycin-Benzoyl Per, Refr, gel Apply 1  application topically at bedtime. 45 g 5  . CRYSELLE-28 0.3-30 MG-MCG tablet Take 1 tablet by mouth daily. 3 Package 4  . escitalopram (LEXAPRO) 10 MG tablet Take 1 tablet (10 mg total) by mouth at bedtime. 90 tablet 0  . predniSONE (STERAPRED UNI-PAK 48 TAB) 10 MG (48) TBPK tablet Take by mouth daily for 12 days. Use as directed for taper 48 tablet 0  . spironolactone (ALDACTONE) 50 MG tablet Take 1 tablet (50 mg total) by mouth 2 (two) times daily. 180 tablet 0  . thyroid (NATURE-THROID) 32.5 MG tablet Take 1 tablet (32.5 mg total) by mouth daily. 90 tablet 0   No current facility-administered medications for this visit.    Allergies  Allergen Reactions  . Eggs Or Egg-Derived Products Hives  . Lac Bovis Other (See Comments)    Rash and hair loss and mouth ulcers  . Meperidine Other (See Comments)    Other reaction(s): Confusion Happened when she was very young.  Marland Kitchen Penicillins     Hands swell       Objective:  BP 125/80   Pulse 89   Temp 98.6 F (37 C) (Oral)   Wt  120 lb (54.4 kg)   BMI 21.00 kg/m  Gen:  alert, not ill-appearing, no distress, appropriate for age HEENT: head normocephalic without obvious abnormality, conjunctiva and cornea clear, trachea midline Pulm: Normal work of breathing, normal phonation Skin: diffuse red, papular rash in linear distribution and some coalescing patches with some associated shallow ulcerations  No results found for this or any previous visit (from the past 72 hour(s)). No results found.    Assessment and Plan: 37 y.o. femal32e with   .Sherry Munoz was seen today for rash.  Diagnoses and all orders for this visit:  Plant dermatitis   Recurrent rash likely due to plant oils from dogs Previous Prednisone burst was too short for plant dermatitis Start Prednisone 12-day taper. Counseled on potential AE. Counseled on general measures for poison ivy, specifically bathing pets and cleaning all furniture/bedding where pets have come in  contact    Patient education and anticipatory guidance given Patient agrees with treatment plan Follow-up as needed if symptoms worsen or fail to improve  Levonne Hubertharley E. Alaney Witter PA-C

## 2018-06-18 ENCOUNTER — Encounter: Payer: Self-pay | Admitting: Physician Assistant

## 2018-06-19 ENCOUNTER — Ambulatory Visit: Payer: BLUE CROSS/BLUE SHIELD | Admitting: Physician Assistant

## 2018-07-06 ENCOUNTER — Other Ambulatory Visit: Payer: Self-pay | Admitting: Physician Assistant

## 2018-07-06 DIAGNOSIS — F411 Generalized anxiety disorder: Secondary | ICD-10-CM

## 2018-07-07 ENCOUNTER — Other Ambulatory Visit: Payer: Self-pay | Admitting: Physician Assistant

## 2018-07-07 DIAGNOSIS — L709 Acne, unspecified: Secondary | ICD-10-CM

## 2018-07-20 ENCOUNTER — Other Ambulatory Visit: Payer: Self-pay | Admitting: Physician Assistant

## 2018-07-20 DIAGNOSIS — F411 Generalized anxiety disorder: Secondary | ICD-10-CM

## 2018-07-20 DIAGNOSIS — L709 Acne, unspecified: Secondary | ICD-10-CM

## 2018-08-21 ENCOUNTER — Other Ambulatory Visit: Payer: Self-pay | Admitting: Physician Assistant

## 2018-08-21 DIAGNOSIS — E038 Other specified hypothyroidism: Secondary | ICD-10-CM

## 2018-08-21 DIAGNOSIS — E039 Hypothyroidism, unspecified: Secondary | ICD-10-CM

## 2018-09-11 ENCOUNTER — Other Ambulatory Visit: Payer: Self-pay

## 2018-09-11 ENCOUNTER — Encounter: Payer: Self-pay | Admitting: Family Medicine

## 2018-09-11 ENCOUNTER — Emergency Department
Admission: EM | Admit: 2018-09-11 | Discharge: 2018-09-11 | Disposition: A | Discharge status: Home / Self Care | Payer: BLUE CROSS/BLUE SHIELD | Source: Home / Self Care

## 2018-09-11 DIAGNOSIS — L237 Allergic contact dermatitis due to plants, except food: Secondary | ICD-10-CM | POA: Diagnosis not present

## 2018-09-11 MED ORDER — METHYLPREDNISOLONE ACETATE 80 MG/ML IJ SUSP
80.0000 mg | Freq: Once | INTRAMUSCULAR | Status: AC
  Administered 2018-09-11: 80 mg via INTRAMUSCULAR

## 2018-09-11 MED ORDER — PREDNISONE 10 MG (21) PO TBPK: ORAL_TABLET | ORAL | 0 refills | Status: DC

## 2018-09-11 NOTE — ED Provider Notes (Signed)
[facility]    CSN: [id number] Arrival date & time: 09/11/18  1527      History   Chief Complaint Chief Complaint  Patient presents with  . Poison Ivy    HPI Sherry Munoz is a 38 y.o. female.   38 yo woman [facility], c/o rash.  It began 5 days ago and is affecting arms, legs, abdomen and face with oozing from some.  Very allergic to poison ivy.  Getting married in 6 weeks.  Note from April 2020: E-visit approx 2 weeks ago patient reported a pruritic rash similar to prior poison oak rashes. She was treated with topical Triamcinolone, which was initially helpful, but rash continued to spread. Her daughters and partner also developed a similar rash. She was given a 5 day course of Prednisone by her daughter's pedatrician. She states she felt better for about 3 days but then rash came back even worse. Rash is present on bilateral arms and trunk. Rash is constant. No improvement with Benadryl. [place] and has dogs that go on furniture in their home.  Rx: 12 day prednisone taper.     Past Medical History:  Diagnosis Date  . Acne   . Anemia   . Anxiety   . B12 deficiency   . Bilateral ovarian cysts   . Dyslipidemia   . Thyroid disease     Patient Active Problem List   Diagnosis Date Noted  . Plant dermatitis 04/16/2018  . Encounter for long-term (current) use of medications 04/15/2018  . Keratosis pilaris 07/25/2017  . History of food allergy 07/25/2017  . Rhinitis 07/25/2017  . Food intolerance in adult 06/06/2017  . History of non anemic vitamin B12 deficiency 06/06/2017  . Encounter for surveillance of contraceptive pills 06/06/2017  . History of ovarian cyst 06/06/2017  . Seborrheic keratoses 06/06/2017  . Encounter for monitoring diuretic therapy 06/07/2016  . Anemia due to vitamin B12 deficiency 06/07/2016  . History of iron deficiency anemia 06/07/2016  . Dyslipidemia 02/19/2016  . Chronic  fatigue 02/19/2016  . Lactose intolerance in adult 02/19/2016  . Subclinical hypothyroidism 02/15/2016  . Adult acne 02/15/2016  . GAD (generalized anxiety disorder) 02/15/2016  . Moderate single current episode of major depressive disorder (HCC) 02/15/2016  . Irregular periods 07/29/2015  . False positive antinuclear antibody (ANA) level 03/03/2014  . Alopecia 03/03/2014    History reviewed. No pertinent surgical history.  OB History   No obstetric history on file.      Home Medications    Prior to Admission medications   Medication Sig Start Date End Date Taking? Authorizing Provider  CRYSELLE-28 0.3-30 MG-MCG tablet Take 1 tablet by mouth daily. 06/06/17   Carlis Stableummings, Charley Elizabeth, PA-C  escitalopram (LEXAPRO) 10 MG tablet Take 1 tablet (10 mg total) by mouth at bedtime. Due for follow up visit w/PCP 07/07/18   Carlis Stableummings, Charley Elizabeth, PA-C  NATURE-THROID 32.5 MG tablet TAKE 1 TABLET (32.5 MG TOTAL) BY MOUTH DAILY. 08/21/18   Carlis Stableummings, Charley Elizabeth, PA-C  predniSONE (STERAPRED UNI-PAK 21 TAB) 10 MG (21) TBPK tablet 3-3-3-2-2-2-1-1-1-1-1-1 daily 09/11/18   Elvina SidleLauenstein, Canio Winokur, MD  spironolactone (ALDACTONE) 50 MG tablet Take 1 tablet (50 mg total) by mouth 2 (two) times daily. Due for follow up visit w/PCP 07/07/18   Carlis Stableummings, Charley Elizabeth, PA-C    Family History Family History  Problem Relation Age of Onset  . Lymphoma Other  grandmother   . Heart attack Father   . Hyperlipidemia Father        grandmother   . Allergic rhinitis Father   . Asthma Father   . Hypertension Mother   . Sjogren's syndrome Mother   . Allergic rhinitis Daughter   . Asthma Daughter   . Allergic rhinitis Son   . Asthma Son   . Angioedema Neg Hx   . Eczema Neg Hx   . Immunodeficiency Neg Hx   . Urticaria Neg Hx     Social History Social History   Tobacco Use  . Smoking status: Never Smoker  . Smokeless tobacco: Never Used  Substance Use Topics  . Alcohol use: No     Alcohol/week: 0.0 standard drinks  . Drug use: No     Allergies   Eggs or egg-derived products, Lac bovis, Meperidine, and Penicillins   Review of Systems Review of Systems   Physical Exam Triage Vital Signs ED Triage Vitals  Enc Vitals Group     BP      Pulse      Resp      Temp      Temp src      SpO2      Weight      Height      Head Circumference      Peak Flow      Pain Score      Pain Loc      Pain Edu?      Excl. in [place]?    No data found.  Updated Vital Signs BP 120/85 (BP Location: Right Arm)   Pulse 90   Temp 98.1 F (36.7 C) (Oral)   Resp 18   Ht 5\' 4"  (1.626 m)   Wt 58.5 kg   LMP  (LMP Unknown)   SpO2 99%   BMI 22.14 kg/m   Physical Exam Vitals signs and nursing note reviewed.  Constitutional:      Appearance: Normal appearance.  Pulmonary:     Effort: Pulmonary effort is normal.  Musculoskeletal: Normal range of motion.  Skin:    Findings: Rash present.     Comments: Diffuse crusting and papular streaky rash on arms, legs and face.  Neurological:     General: No focal deficit present.     Mental Status: She is alert.  Psychiatric:        Mood and Affect: Mood normal.      UC Treatments / Results  Labs (all labs ordered are listed, but only abnormal results are displayed) Labs Reviewed - No data to display  EKG   Radiology No results found.  Procedures Procedures (including critical care time)  Medications Ordered in UC Medications  methylPREDNISolone acetate (DEPO-MEDROL) injection 80 mg (80 mg Intramuscular Given 09/11/18 1554)    Initial Impression / Assessment and Plan / UC Course  I have reviewed the triage vital signs and the nursing notes.  Pertinent labs & imaging results that were available during my care of the patient were reviewed by me and considered in my medical decision making (see chart for details).    Final Clinical Impressions(s) / UC Diagnoses   Final diagnoses:  Poison ivy dermatitis    Discharge Instructions   None    ED Prescriptions    Medication Sig Dispense Auth. Provider   predniSONE (STERAPRED UNI-PAK 21 TAB) 10 MG (21) TBPK tablet 3-3-3-2-2-2-1-1-1-1-1-1 daily 1 each Robyn Haber, MD     Controlled Substance Prescriptions  Controlled  Substance Registry consulted? Not Applicable   Elvina Sidle, MD 09/11/18 1555

## 2018-09-11 NOTE — ED Triage Notes (Signed)
Pt c/o rash from poison ivy that started Saturday. Thinks dog brought it in from outside. Hx of bad rxns to poison ivy in past. Getting married in 3 weeks.

## 2018-09-15 ENCOUNTER — Other Ambulatory Visit: Payer: Self-pay | Admitting: Physician Assistant

## 2018-09-15 DIAGNOSIS — F411 Generalized anxiety disorder: Secondary | ICD-10-CM

## 2018-09-15 DIAGNOSIS — L709 Acne, unspecified: Secondary | ICD-10-CM

## 2018-09-27 ENCOUNTER — Telehealth: Payer: Self-pay | Admitting: Emergency Medicine

## 2018-09-27 ENCOUNTER — Other Ambulatory Visit: Payer: Self-pay | Admitting: Physician Assistant

## 2018-09-27 DIAGNOSIS — L709 Acne, unspecified: Secondary | ICD-10-CM

## 2018-09-27 NOTE — Telephone Encounter (Signed)
Patient states her rash has not cleared; she took prednisone incorrectly and finished too soon. Wonders what she should do. Suggested she return for visit for re-evaluation since last visit 09/11/18.

## 2018-10-06 ENCOUNTER — Telehealth: Payer: Self-pay | Admitting: Physician Assistant

## 2018-10-06 DIAGNOSIS — L709 Acne, unspecified: Secondary | ICD-10-CM

## 2018-10-06 MED ORDER — SPIRONOLACTONE 50 MG PO TABS: 50.0000 mg | ORAL_TABLET | Freq: Two times a day (BID) | ORAL | 0 refills | Status: DC

## 2018-10-06 NOTE — Telephone Encounter (Signed)
Short supply sent under covering provider

## 2018-10-06 NOTE — Telephone Encounter (Signed)
Spironolactone and possibly the thyroid medication, but she didn't know because she didn't have the medication on her.

## 2018-10-06 NOTE — Telephone Encounter (Signed)
What medication

## 2018-10-06 NOTE — Telephone Encounter (Signed)
Patient was needing a medication refill only up until her follow up appointment with Dr. Sheppard Coil on 10/29/2018. Please Advise.

## 2018-10-13 ENCOUNTER — Telehealth: Payer: Self-pay | Admitting: Physician Assistant

## 2018-10-13 DIAGNOSIS — E038 Other specified hypothyroidism: Secondary | ICD-10-CM

## 2018-10-13 DIAGNOSIS — E039 Hypothyroidism, unspecified: Secondary | ICD-10-CM

## 2018-10-13 NOTE — Telephone Encounter (Signed)
Pt called. There has been a recall on her thyroid med, Nature Thyroid. Pharmacy said to let provider know.  Thank you.

## 2018-10-14 NOTE — Telephone Encounter (Signed)
Left VM for Pt to see if she has tried/failed any other thyroid medications in the past. Requested return call.

## 2018-10-14 NOTE — Telephone Encounter (Signed)
Thank you :)

## 2018-10-14 NOTE — Telephone Encounter (Signed)
See if she is okay with going back to Synthroid or levothyroxine

## 2018-10-15 MED ORDER — SYNTHROID 50 MCG PO TABS: 50.0000 ug | ORAL_TABLET | Freq: Every day | ORAL | 1 refills | Status: DC

## 2018-10-15 NOTE — Telephone Encounter (Signed)
Pt returned clinic call and advised she has not tried any other thyroid Rx before. She is open to what PCP feels the best alternative is. She does report the nature thyroid has worked really well for her.  She is using [facility] [address]

## 2018-10-15 NOTE — Addendum Note (Signed)
Addended by: Beatrice Lecher D on: 10/15/2018 12:55 PM   Modules accepted: Orders

## 2018-10-15 NOTE — Telephone Encounter (Signed)
Pt advised. Declined flu shot. Chart updated. Lab ordered.

## 2018-10-15 NOTE — Telephone Encounter (Signed)
Okay, new prescription sent for branded Synthroid 50 mcg daily.  She will take it in the morning about 1 hour before food.  And make sure to take at least 4 hours away from any type of supplement or vitamin.  She will need to have her TSH rechecked in 6 weeks..  Also see if she has had a flu vaccine this year.

## 2018-10-15 NOTE — Addendum Note (Signed)
Addended by: Alena Bills R on: 10/15/2018 02:41 PM   Modules accepted: Orders

## 2018-10-22 ENCOUNTER — Other Ambulatory Visit: Payer: Self-pay | Admitting: Family Medicine

## 2018-10-28 ENCOUNTER — Other Ambulatory Visit: Payer: Self-pay | Admitting: Physician Assistant

## 2018-10-28 DIAGNOSIS — L709 Acne, unspecified: Secondary | ICD-10-CM

## 2018-10-29 ENCOUNTER — Encounter: Payer: Self-pay | Admitting: Osteopathic Medicine

## 2018-10-29 ENCOUNTER — Ambulatory Visit (INDEPENDENT_AMBULATORY_CARE_PROVIDER_SITE_OTHER): Payer: BLUE CROSS/BLUE SHIELD | Admitting: Osteopathic Medicine

## 2018-10-29 VITALS — Temp 97.5°F | Wt 125.0 lb

## 2018-10-29 DIAGNOSIS — Z8742 Personal history of other diseases of the female genital tract: Secondary | ICD-10-CM

## 2018-10-29 DIAGNOSIS — F411 Generalized anxiety disorder: Secondary | ICD-10-CM | POA: Diagnosis not present

## 2018-10-29 DIAGNOSIS — E559 Vitamin D deficiency, unspecified: Secondary | ICD-10-CM | POA: Diagnosis not present

## 2018-10-29 DIAGNOSIS — L709 Acne, unspecified: Secondary | ICD-10-CM

## 2018-10-29 DIAGNOSIS — Z3041 Encounter for surveillance of contraceptive pills: Secondary | ICD-10-CM | POA: Diagnosis not present

## 2018-10-29 DIAGNOSIS — E039 Hypothyroidism, unspecified: Secondary | ICD-10-CM

## 2018-10-29 DIAGNOSIS — E038 Other specified hypothyroidism: Secondary | ICD-10-CM

## 2018-10-29 MED ORDER — ESCITALOPRAM OXALATE 10 MG PO TABS: 15.0000 mg | ORAL_TABLET | Freq: Every day | ORAL | 3 refills | Status: DC

## 2018-10-29 MED ORDER — THYROID 32.5 MG PO TABS: 32.5000 mg | ORAL_TABLET | Freq: Every day | ORAL | 0 refills | Status: DC

## 2018-10-29 MED ORDER — SPIRONOLACTONE 50 MG PO TABS: 50.0000 mg | ORAL_TABLET | Freq: Two times a day (BID) | ORAL | 0 refills | Status: DC

## 2018-10-29 MED ORDER — CRYSELLE-28 0.3-30 MG-MCG PO TABS: 1.0000 | ORAL_TABLET | Freq: Every day | ORAL | 4 refills | Status: AC

## 2018-10-29 NOTE — Progress Notes (Signed)
Virtual Visit via Video (App used: Doximity) Note  I connected with      Sherry Munoz on 10/29/18 at 3:40 by a telemedicine application and verified that I am speaking with the correct person using two identifiers.  Patient is at home I am in office   I discussed the limitations of evaluation and management by telemedicine and the availability of in person appointments. The patient expressed understanding and agreed to proceed.  History of Present Illness: Sherry Munoz is a 38 y.o. female who would like to discuss medication refills    Doing well on current medications.  Needs checkup on thyroid levels.  Levels have not been checked since 06/06/2017.  We had sent in Synthroid but patient is actually still taking Nature-Throid, she states her pharmacy was not sure why the switch was made.  Acne: Well-controlled on spironolactone, due for labs.  Zaidi: Had decreased to 1 tablet / 10 mg daily, felt that she was better controlled on Lexapro 1.5 tablets daily, like to go back up to on this  GAD 7 : Generalized Anxiety Score 10/29/2018 04/15/2018 06/07/2016 02/15/2016  Nervous, Anxious, on Edge 1 0 1 3  Control/stop worrying 0 0 2 3  Worry too much - different things 0 0 2 3  Trouble relaxing 0 0 0 3  Restless 0 0 0 1  Easily annoyed or irritable 1 0 1 3  Afraid - awful might happen 0 0 0 0  Total GAD 7 Score 2 0 6 16  Anxiety Difficulty Not difficult at all - - -    Depression screen [facility] 2/9 10/29/2018 04/15/2018 06/07/2016  Decreased Interest 0 0 1  Down, Depressed, Hopeless 0 0 1  PHQ - 2 Score 0 0 2  Altered sleeping - 0 2  Tired, decreased energy - 2 3  Change in appetite - 0 0  Feeling bad or failure about yourself  - 0 0  Trouble concentrating - 1 1  Moving slowly or fidgety/restless - 0 1  Suicidal thoughts - 0 0  PHQ-9 Score - 3 9      Observations/Objective: Temp (!) 97.5 F (36.4 C) (Oral)   Wt 125 lb (56.7 kg)   BMI 21.46 kg/m  BP Readings from Last 3  Encounters:  09/11/18 120/85  05/01/18 125/80  07/25/17 116/64   Exam: Normal Speech.  NAD  Lab and Radiology Results Results for orders placed or performed in visit on 10/29/18 (from the past 72 hour(s))  TSH     Status: None   Collection Time: 10/30/18 10:06 AM  Result Value Ref Range   TSH 1.38 mIU/L    Comment:           Reference Range .           > or = 20 Years  0.40-4.50 .                Pregnancy Ranges           First trimester    0.26-2.66           Second trimester   0.55-2.73           Third trimester    0.43-2.91   COMPLETE METABOLIC PANEL WITH GFR     Status: None   Collection Time: 10/30/18 10:06 AM  Result Value Ref Range   Glucose, Bld 79 65 - 99 mg/dL    Comment: .  Fasting reference interval .    BUN 13 7 - 25 mg/dL   Creat 8.36 6.29 - 4.76 mg/dL   GFR, Est Non African American 96 > OR = 60 mL/min/1.40m2   GFR, Est African American 112 > OR = 60 mL/min/1.49m2   BUN/Creatinine Ratio NOT APPLICABLE 6 - 22 (calc)   Sodium 138 135 - 146 mmol/L   Potassium 4.6 3.5 - 5.3 mmol/L   Chloride 105 98 - 110 mmol/L   CO2 26 20 - 32 mmol/L   Calcium 9.7 8.6 - 10.2 mg/dL   Total Protein 6.8 6.1 - 8.1 g/dL   Albumin 4.2 3.6 - 5.1 g/dL   Globulin 2.6 1.9 - 3.7 g/dL (calc)   AG Ratio 1.6 1.0 - 2.5 (calc)   Total Bilirubin 0.6 0.2 - 1.2 mg/dL   Alkaline phosphatase (APISO) 54 31 - 125 U/L   AST 12 10 - 30 U/L   ALT 10 6 - 29 U/L  CBC     Status: None   Collection Time: 10/30/18 10:06 AM  Result Value Ref Range   WBC 10.0 3.8 - 10.8 Thousand/uL   RBC 4.47 3.80 - 5.10 Million/uL   Hemoglobin 13.7 11.7 - 15.5 g/dL   HCT 54.6 50.3 - 54.6 %   MCV 89.3 80.0 - 100.0 fL   MCH 30.6 27.0 - 33.0 pg   MCHC 34.3 32.0 - 36.0 g/dL   RDW 56.8 12.7 - 51.7 %   Platelets 297 140 - 400 Thousand/uL   MPV 10.3 7.5 - 12.5 fL  VITAMIN D 25 Hydroxy (Vit-D Deficiency, Fractures)     Status: None   Collection Time: 10/30/18 10:06 AM  Result Value Ref Range   Vit D,  25-Hydroxy 38 30 - 100 ng/mL    Comment: Vitamin D Status         25-OH Vitamin D: . Deficiency:                    <20 ng/mL Insufficiency:             20 - 29 ng/mL Optimal:                 > or = 30 ng/mL . For 25-OH Vitamin D testing on patients on  D2-supplementation and patients for whom quantitation  of D2 and D3 fractions is required, the Quest[facility]ureD(TM) 25-OH VIT D, (D2,D3), LC/MS/MS is recommended: order  code 00174 (patients >82yrs). See Note 1 . Note 1 . For additional information, please refer to  http://education.[facility].com/faq/FAQ199  (This link is being provided for informational/ educational purposes only.)   Lipid panel     Status: Abnormal   Collection Time: 10/30/18 10:06 AM  Result Value Ref Range   Cholesterol 192 <200 mg/dL   HDL 67 > OR = 50 mg/dL   Triglycerides 944 <967 mg/dL   LDL Cholesterol (Calc) 102 (H) mg/dL (calc)    Comment: Reference range: <100 . Desirable range <100 mg/dL for primary prevention;   <70 mg/dL for patients with CHD or diabetic patients  with > or = 2 CHD risk factors. Marland Kitchen LDL-C is now calculated using the Martin-Hopkins  calculation, which is a validated novel method providing  better accuracy than the Friedewald equation in the  estimation of LDL-C.  Horald Pollen et al. Lenox Ahr. 5916;384(66): 2061-2068  (http://education.[facility].com/faq/FAQ164)    Total CHOL/HDL Ratio 2.9 <5.0 (calc)   Non-HDL Cholesterol (Calc) 125 <130 mg/dL (calc)    Comment: For patients with  diabetes plus 1 major ASCVD risk  factor, treating to a non-HDL-C goal of <100 mg/dL  (LDL-C of <16 mg/dL) is considered a therapeutic  option.    No results found.     Assessment and Plan: 38 y.o. female with The primary encounter diagnosis was Vitamin D deficiency. Diagnoses of GAD (generalized anxiety disorder), Encounter for surveillance of contraceptive pills, History of ovarian cyst, Subclinical hypothyroidism, and Adult acne were also  pertinent to this visit.   PDMP not reviewed this encounter. Orders Placed This Encounter  Procedures  . TSH  . COMPLETE METABOLIC PANEL WITH GFR  . CBC  . VITAMIN D 25 Hydroxy (Vit-D Deficiency, Fractures)  . Lipid panel   Meds ordered this encounter  Medications  . escitalopram (LEXAPRO) 10 MG tablet    Sig: Take 1.5 tablets (15 mg total) by mouth daily.    Dispense:  135 tablet    Refill:  3  . CRYSELLE-28 0.3-30 MG-MCG tablet    Sig: Take 1 tablet by mouth daily.    Dispense:  3 Package    Refill:  4    Please dispense 3 months at a time  . DISCONTD: thyroid (NATURE-THROID) 32.5 MG tablet    Sig: Take 1 tablet (32.5 mg total) by mouth daily.    Dispense:  30 tablet    Refill:  0  . DISCONTD: spironolactone (ALDACTONE) 50 MG tablet    Sig: Take 1 tablet (50 mg total) by mouth 2 (two) times daily. Due for follow up visit w/PCP    Dispense:  60 tablet    Refill:  0    No refills. Pt is overdue for f/u appt w/PCP.  Marland Kitchen thyroid (NATURE-THROID) 32.5 MG tablet    Sig: Take 1 tablet (32.5 mg total) by mouth daily.    Dispense:  90 tablet    Refill:  3  . spironolactone (ALDACTONE) 50 MG tablet    Sig: Take 1 tablet (50 mg total) by mouth 2 (two) times daily.    Dispense:  180 tablet    Refill:  3    No refills. Pt is overdue for f/u appt w/PCP.   There are no Patient Instructions on file for this visit.  Instructions sent via MyChart. If MyChart not available, pt was given option for info via personal e-mail w/ no guarantee of protected health info over unsecured e-mail communication, and MyChart sign-up instructions were included.   Follow Up Instructions: No follow-ups on file.    I discussed the assessment and treatment plan with the patient. The patient was provided an opportunity to ask questions and all were answered. The patient agreed with the plan and demonstrated an understanding of the instructions.   The patient was advised to call back or seek an in-person  evaluation if any new concerns, if symptoms worsen or if the condition fails to improve as anticipated.  25 minutes of non-face-to-face time was provided during this encounter.                      Historical information moved to improve visibility of documentation.  Past Medical History:  Diagnosis Date  . Acne   . Anemia   . Anxiety   . B12 deficiency   . Bilateral ovarian cysts   . Dyslipidemia   . Thyroid disease    No past surgical history on file. Social History   Tobacco Use  . Smoking status: Never Smoker  . Smokeless tobacco: Never Used  Substance Use Topics  . Alcohol use: No    Alcohol/week: 0.0 standard drinks   family history includes Allergic rhinitis in her daughter, father, and son; Asthma in her daughter, father, and son; Heart attack in her father; Hyperlipidemia in her father; Hypertension in her mother; Lymphoma in an other family member; Sjogren's syndrome in her mother.  Medications: Current Outpatient Medications  Medication Sig Dispense Refill  . CRYSELLE-28 0.3-30 MG-MCG tablet Take 1 tablet by mouth daily. 3 Package 4  . escitalopram (LEXAPRO) 10 MG tablet Take 1.5 tablets (15 mg total) by mouth daily. 135 tablet 3  . spironolactone (ALDACTONE) 50 MG tablet Take 1 tablet (50 mg total) by mouth 2 (two) times daily. 180 tablet 3  . thyroid (NATURE-THROID) 32.5 MG tablet Take 1 tablet (32.5 mg total) by mouth daily. 90 tablet 3   No current facility-administered medications for this visit.    Allergies  Allergen Reactions  . Eggs Or Egg-Derived Products Hives  . Lac Bovis Other (See Comments)    Rash and hair loss and mouth ulcers  . Meperidine Other (See Comments)    Other reaction(s): Confusion Happened when she was very young.  Marland Kitchen. Penicillins     Hands swell    PDMP not reviewed this encounter. Orders Placed This Encounter  Procedures  . TSH  . COMPLETE METABOLIC PANEL WITH GFR  . CBC  . VITAMIN D 25 Hydroxy (Vit-D  Deficiency, Fractures)  . Lipid panel   Meds ordered this encounter  Medications  . escitalopram (LEXAPRO) 10 MG tablet    Sig: Take 1.5 tablets (15 mg total) by mouth daily.    Dispense:  135 tablet    Refill:  3  . CRYSELLE-28 0.3-30 MG-MCG tablet    Sig: Take 1 tablet by mouth daily.    Dispense:  3 Package    Refill:  4    Please dispense 3 months at a time  . DISCONTD: thyroid (NATURE-THROID) 32.5 MG tablet    Sig: Take 1 tablet (32.5 mg total) by mouth daily.    Dispense:  30 tablet    Refill:  0  . DISCONTD: spironolactone (ALDACTONE) 50 MG tablet    Sig: Take 1 tablet (50 mg total) by mouth 2 (two) times daily. Due for follow up visit w/PCP    Dispense:  60 tablet    Refill:  0    No refills. Pt is overdue for f/u appt w/PCP.  Marland Kitchen. thyroid (NATURE-THROID) 32.5 MG tablet    Sig: Take 1 tablet (32.5 mg total) by mouth daily.    Dispense:  90 tablet    Refill:  3  . spironolactone (ALDACTONE) 50 MG tablet    Sig: Take 1 tablet (50 mg total) by mouth 2 (two) times daily.    Dispense:  180 tablet    Refill:  3    No refills. Pt is overdue for f/u appt w/PCP.

## 2018-10-30 LAB — COMPLETE METABOLIC PANEL WITH GFR
AG Ratio: 1.6 (calc) (ref 1.0–2.5)
ALT: 10 U/L (ref 6–29)
AST: 12 U/L (ref 10–30)
Albumin: 4.2 g/dL (ref 3.6–5.1)
Alkaline phosphatase (APISO): 54 U/L (ref 31–125)
BUN: 13 mg/dL (ref 7–25)
CO2: 26 mmol/L (ref 20–32)
Calcium: 9.7 mg/dL (ref 8.6–10.2)
Chloride: 105 mmol/L (ref 98–110)
Creat: 0.78 mg/dL (ref 0.50–1.10)
GFR, Est African American: 112 mL/min/{1.73_m2} (ref >=60)
GFR, Est Non African American: 96 mL/min/{1.73_m2} (ref >=60)
Globulin: 2.6 g/dL (calc) (ref 1.9–3.7)
Glucose, Bld: 79 mg/dL (ref 65–99)
Potassium: 4.6 mmol/L (ref 3.5–5.3)
Sodium: 138 mmol/L (ref 135–146)
Total Bilirubin: 0.6 mg/dL (ref 0.2–1.2)
Total Protein: 6.8 g/dL (ref 6.1–8.1)

## 2018-10-30 LAB — LIPID PANEL
Cholesterol: 192 mg/dL (ref <200)
HDL: 67 mg/dL (ref >=50)
LDL Cholesterol (Calc): 102 mg/dL (calc) — ABNORMAL HIGH
Non-HDL Cholesterol (Calc): 125 mg/dL (calc) (ref <130)
Total CHOL/HDL Ratio: 2.9 (calc) (ref <5.0)
Triglycerides: 134 mg/dL (ref <150)

## 2018-10-30 LAB — CBC
HCT: 39.9 % (ref 35.0–45.0)
Hemoglobin: 13.7 g/dL (ref 11.7–15.5)
MCH: 30.6 pg (ref 27.0–33.0)
MCHC: 34.3 g/dL (ref 32.0–36.0)
MCV: 89.3 fL (ref 80.0–100.0)
MPV: 10.3 fL (ref 7.5–12.5)
Platelets: 297 10*3/uL (ref 140–400)
RBC: 4.47 10*6/uL (ref 3.80–5.10)
RDW: 12.2 % (ref 11.0–15.0)
WBC: 10 10*3/uL (ref 3.8–10.8)

## 2018-10-30 LAB — TSH: TSH: 1.38 mIU/L

## 2018-10-30 LAB — VITAMIN D 25 HYDROXY (VIT D DEFICIENCY, FRACTURES): Vit D, 25-Hydroxy: 38 ng/mL (ref 30–100)

## 2018-10-31 MED ORDER — THYROID 32.5 MG PO TABS: 32.5000 mg | ORAL_TABLET | Freq: Every day | ORAL | 3 refills | Status: DC

## 2018-10-31 MED ORDER — SPIRONOLACTONE 50 MG PO TABS: 50.0000 mg | ORAL_TABLET | Freq: Two times a day (BID) | ORAL | 3 refills | Status: DC

## 2018-11-17 ENCOUNTER — Telehealth: Payer: Self-pay | Admitting: Osteopathic Medicine

## 2018-11-17 NOTE — Telephone Encounter (Addendum)
Error

## 2018-12-09 ENCOUNTER — Encounter: Payer: Self-pay | Admitting: Osteopathic Medicine

## 2018-12-09 DIAGNOSIS — Z20822 Contact with and (suspected) exposure to covid-19: Secondary | ICD-10-CM

## 2018-12-09 DIAGNOSIS — Z20828 Contact with and (suspected) exposure to other viral communicable diseases: Secondary | ICD-10-CM

## 2018-12-10 NOTE — Telephone Encounter (Signed)
I pended order.. please verify that this is the correct one.  Thanks!

## 2018-12-10 NOTE — Telephone Encounter (Signed)
Yep, looks like correct order! I signed it, not sure if this needs faxed to a [facility] or patient is picking it up?

## 2018-12-18 LAB — SAR COV2 SEROLOGY (COVID19)AB(IGG),IA: DiaSorin SARS-CoV-2 Ab, IgG: NEGATIVE

## 2018-12-22 ENCOUNTER — Ambulatory Visit (INDEPENDENT_AMBULATORY_CARE_PROVIDER_SITE_OTHER): Payer: BLUE CROSS/BLUE SHIELD

## 2018-12-22 ENCOUNTER — Ambulatory Visit (INDEPENDENT_AMBULATORY_CARE_PROVIDER_SITE_OTHER): Payer: BLUE CROSS/BLUE SHIELD | Admitting: Sports Medicine

## 2018-12-22 ENCOUNTER — Encounter: Payer: Self-pay | Admitting: Sports Medicine

## 2018-12-22 ENCOUNTER — Ambulatory Visit (HOSPITAL_BASED_OUTPATIENT_CLINIC_OR_DEPARTMENT_OTHER)
Admission: RE | Admit: 2018-12-22 | Discharge: 2018-12-22 | Disposition: A | Discharge status: Home / Self Care | Payer: BLUE CROSS/BLUE SHIELD | Source: Ambulatory Visit | Attending: Sports Medicine | Admitting: Sports Medicine

## 2018-12-22 ENCOUNTER — Other Ambulatory Visit: Payer: Self-pay

## 2018-12-22 DIAGNOSIS — R7989 Other specified abnormal findings of blood chemistry: Secondary | ICD-10-CM

## 2018-12-22 DIAGNOSIS — R1012 Left upper quadrant pain: Secondary | ICD-10-CM | POA: Diagnosis not present

## 2018-12-22 DIAGNOSIS — R0602 Shortness of breath: Secondary | ICD-10-CM | POA: Insufficient documentation

## 2018-12-22 LAB — CBC WITH DIFFERENTIAL/PLATELET
Absolute Monocytes: 546 cells/uL (ref 200–950)
Basophils Absolute: 34 cells/uL (ref 0–200)
Basophils Relative: 0.4 %
Eosinophils Absolute: 42 cells/uL (ref 15–500)
Eosinophils Relative: 0.5 %
HCT: 38.7 % (ref 35.0–45.0)
Hemoglobin: 13.4 g/dL (ref 11.7–15.5)
Lymphs Abs: 1596 cells/uL (ref 850–3900)
MCH: 31.3 pg (ref 27.0–33.0)
MCHC: 34.6 g/dL (ref 32.0–36.0)
MCV: 90.4 fL (ref 80.0–100.0)
MPV: 10.7 fL (ref 7.5–12.5)
Monocytes Relative: 6.5 %
Neutro Abs: 6182 cells/uL (ref 1500–7800)
Neutrophils Relative %: 73.6 %
Platelets: 300 10*3/uL (ref 140–400)
RBC: 4.28 10*6/uL (ref 3.80–5.10)
RDW: 11.3 % (ref 11.0–15.0)
Total Lymphocyte: 19 %
WBC: 8.4 10*3/uL (ref 3.8–10.8)

## 2018-12-22 LAB — COMPLETE METABOLIC PANEL WITH GFR
AG Ratio: 1.6 (calc) (ref 1.0–2.5)
ALT: 12 U/L (ref 6–29)
AST: 12 U/L (ref 10–30)
Albumin: 4.2 g/dL (ref 3.6–5.1)
Alkaline phosphatase (APISO): 52 U/L (ref 31–125)
BUN/Creatinine Ratio: 7 (calc) (ref 6–22)
BUN: 8 mg/dL (ref 7–25)
CO2: 25 mmol/L (ref 20–32)
Calcium: 9.7 mg/dL (ref 8.6–10.2)
Chloride: 105 mmol/L (ref 98–110)
Creat: 1.16 mg/dL — ABNORMAL HIGH (ref 0.50–1.10)
GFR, Est African American: 69 mL/min/{1.73_m2} (ref >=60)
GFR, Est Non African American: 60 mL/min/{1.73_m2} (ref >=60)
Globulin: 2.6 g/dL (calc) (ref 1.9–3.7)
Glucose, Bld: 93 mg/dL (ref 65–99)
Potassium: 4.3 mmol/L (ref 3.5–5.3)
Sodium: 138 mmol/L (ref 135–146)
Total Bilirubin: 0.5 mg/dL (ref 0.2–1.2)
Total Protein: 6.8 g/dL (ref 6.1–8.1)

## 2018-12-22 LAB — D-DIMER, QUANTITATIVE: D-Dimer, Quant: 0.98 mcg/mL FEU — ABNORMAL HIGH (ref <0.50)

## 2018-12-22 LAB — LIPASE: Lipase: 18 U/L (ref 7–60)

## 2018-12-22 LAB — AMYLASE: Amylase: 65 U/L (ref 21–101)

## 2018-12-22 MED ORDER — IOHEXOL 350 MG/ML SOLN
100.0000 mL | Freq: Once | INTRAVENOUS | Status: AC | PRN
  Administered 2018-12-22: 50 mL via INTRAVENOUS

## 2018-12-22 MED ORDER — PANTOPRAZOLE SODIUM 40 MG PO TBEC: 40.0000 mg | DELAYED_RELEASE_TABLET | Freq: Two times a day (BID) | ORAL | 3 refills | Status: DC

## 2018-12-22 MED ORDER — SUCRALFATE 1 G PO TABS: 1.0000 g | ORAL_TABLET | Freq: Four times a day (QID) | ORAL | 0 refills | Status: DC

## 2018-12-22 MED ORDER — AZITHROMYCIN 250 MG PO TABS: ORAL_TABLET | ORAL | 0 refills | Status: DC

## 2018-12-22 MED ORDER — IOHEXOL 300 MG/ML  SOLN
100.0000 mL | Freq: Once | INTRAMUSCULAR | Status: AC | PRN
  Administered 2018-12-22: 100 mL via INTRAVENOUS

## 2018-12-22 NOTE — Addendum Note (Signed)
Addended by: Silverio Decamp on: 12/22/2018 07:31 PM   Modules accepted: Orders

## 2018-12-22 NOTE — Assessment & Plan Note (Addendum)
Unfortunately D-dimer has come back elevated, considering pleuritic left upper chest pain we do need to proceed with CT angiogram of the pulmonary arteries even though she has already had an abdominal CT.  I will order it, she probably needs to do it at [facility] now.  CT angio shows left lower pleural effusion with some pulmonary nodules.  These are likely infectious.  She told me her daughter just had COVID-19.  She has mild symptoms so this is likely a viral pneumonia.  Adding some azithromycin, return for repeat scan in a month to ensure clearance.

## 2018-12-22 NOTE — Addendum Note (Signed)
Addended by: Silverio Decamp on: 12/22/2018 04:31 PM   Modules accepted: Orders

## 2018-12-22 NOTE — Progress Notes (Addendum)
Subjective:    CC: Chest/abdominal pain  HPI: Yared is a very pleasant 38 year old female, for the past several days she has had increasing left upper quadrant abdominal pain/left lower chest pain, worse with deep breathing, worse when laying flat at night, moderate, persistent, localized without radiation with the exception of occasional radiation to the back.  Its not affected by food, no muscle aches, body aches, no fevers, chills, no GI symptoms, no upper respiratory symptoms.  No sick contacts.  No skin rash.  I reviewed the past medical history, family history, social history, surgical history, and allergies today and no changes were needed.  Please see the problem list section below in epic for further details.  Past Medical History: Past Medical History:  Diagnosis Date  . Acne   . Anemia   . Anxiety   . B12 deficiency   . Bilateral ovarian cysts   . Dyslipidemia   . Thyroid disease    Past Surgical History: No past surgical history on file. Social History: Social History   Socioeconomic History  . Marital status: Divorced    Spouse name: Not on file  . Number of children: Not on file  . Years of education: Not on file  . Highest education level: Not on file  Occupational History  . Not on file  Tobacco Use  . Smoking status: Never Smoker  . Smokeless tobacco: Never Used  Substance and Sexual Activity  . Alcohol use: No    Alcohol/week: 0.0 standard drinks  . Drug use: No  . Sexual activity: Yes    Partners: Male  Other Topics Concern  . Not on file  Social History Narrative  . Not on file   Social Determinants of Health   Financial Resource Strain:   . Difficulty of Paying Living Expenses: Not on file  Food Insecurity:   . Worried About Charity fundraiser in the Last Year: Not on file  . Ran Out of Food in the Last Year: Not on file  Transportation Needs:   . Lack of Transportation (Medical): Not on file  . Lack of Transportation (Non-Medical):  Not on file  Physical Activity:   . Days of Exercise per Week: Not on file  . Minutes of Exercise per Session: Not on file  Stress:   . Feeling of Stress : Not on file  Social Connections:   . Frequency of Communication with Friends and Family: Not on file  . Frequency of Social Gatherings with Friends and Family: Not on file  . Attends Religious Services: Not on file  . Active Member of Clubs or Organizations: Not on file  . Attends Archivist Meetings: Not on file  . Marital Status: Not on file   Family History: Family History  Problem Relation Age of Onset  . Lymphoma Other        grandmother   . Heart attack Father   . Hyperlipidemia Father        grandmother   . Allergic rhinitis Father   . Asthma Father   . Hypertension Mother   . Sjogren's syndrome Mother   . Allergic rhinitis Daughter   . Asthma Daughter   . Allergic rhinitis Son   . Asthma Son   . Angioedema Neg Hx   . Eczema Neg Hx   . Immunodeficiency Neg Hx   . Urticaria Neg Hx    Allergies: Allergies  Allergen Reactions  . Eggs Or Egg-Derived Products Hives  . Lac Bovis Other (  See Comments)    Rash and hair loss and mouth ulcers  . Meperidine Other (See Comments)    Other reaction(s): Confusion Happened when she was very young.  Marland Kitchen Penicillins     Hands swell   Medications: See med rec.  Review of Systems: No fevers, chills, night sweats, weight loss, chest pain, or shortness of breath.   Objective:    General: Well Developed, well nourished, and in no acute distress.  Neuro: Alert and oriented x3, extra-ocular muscles intact, sensation grossly intact.  HEENT: Normocephalic, atraumatic, pupils equal round reactive to light, neck supple, no masses, no lymphadenopathy, thyroid nonpalpable.  Skin: Warm and dry, no rashes. Cardiac: Regular rate and rhythm, no murmurs rubs or gallops, no lower extremity edema.  Respiratory: Clear to auscultation bilaterally. Not using accessory muscles,  speaking in full sentences. Abdomen: Soft, tender to palpation of the left upper quadrant, nondistended, bowel sounds, no palpable masses, no guarding, rigidity, rebound tenderness, no costovertebral angle pain, specifically no tenderness along the entirety of the cartilaginous costal margin.  Impression and Recommendations:    Abdominal pain, left upper quadrant Left upper quadrant abdominal pain, not over the costal margin. She is post breast augmentation. Pain is worse when laying flat, not worsened or made better with food. CBC, CMP, amylase, lipase, D-dimer. Left-sided rib series. CT abdomen pelvis with oral and IV contrast. Unclear etiology, certainly hiatal hernia and PUD could present similarly. Return to see me next week.  Labs look normal, CT scan is normal, this is all good news, considering the positional nature of her discomfort I am going to add Protonix twice a day and Carafate.  If this does not work over the next few weeks we will have gastroenterology do an upper endoscopy.  Discontinue acid blockers.  With CT findings I don't think theres any abdominal process to worry about .  Positive D dimer Unfortunately D-dimer has come back elevated, considering pleuritic left upper chest pain we do need to proceed with CT angiogram of the pulmonary arteries even though she has already had an abdominal CT.  I will order it, she probably needs to do it at [facility] now.  CT angio shows left lower pleural effusion with some pulmonary nodules.  These are likely infectious.  She told me her daughter just had COVID-19.  She has mild symptoms so this is likely a viral pneumonia.  Adding some azithromycin, return for repeat scan in a month to ensure clearance.   ___________________________________________ Ihor Austin. Benjamin Stain, M.D., ABFM., CAQSM. Primary Care and Sports Medicine [facility]  Adjunct Professor of Family Medicine  [employer]

## 2018-12-22 NOTE — Assessment & Plan Note (Addendum)
Left upper quadrant abdominal pain, not over the costal margin. She is post breast augmentation. Pain is worse when laying flat, not worsened or made better with food. CBC, CMP, amylase, lipase, D-dimer. Left-sided rib series. CT abdomen pelvis with oral and IV contrast. Unclear etiology, certainly hiatal hernia and PUD could present similarly. Return to see me next week.  Labs look normal, CT scan is normal, this is all good news, considering the positional nature of her discomfort I am going to add Protonix twice a day and Carafate.  If this does not work over the next few weeks we will have gastroenterology do an upper endoscopy.  Discontinue acid blockers.  With CT findings I don't think theres any abdominal process to worry about .

## 2018-12-22 NOTE — Addendum Note (Signed)
Addended by: Silverio Decamp on: 12/22/2018 04:43 PM   Modules accepted: Orders

## 2018-12-22 NOTE — Addendum Note (Signed)
Addended by: Silverio Decamp on: 12/22/2018 07:25 PM   Modules accepted: Orders

## 2018-12-23 ENCOUNTER — Other Ambulatory Visit (HOSPITAL_BASED_OUTPATIENT_CLINIC_OR_DEPARTMENT_OTHER): Payer: BLUE CROSS/BLUE SHIELD

## 2018-12-30 ENCOUNTER — Telehealth: Payer: Self-pay

## 2018-12-30 ENCOUNTER — Ambulatory Visit: Payer: BLUE CROSS/BLUE SHIELD | Admitting: Sports Medicine

## 2018-12-30 DIAGNOSIS — E038 Other specified hypothyroidism: Secondary | ICD-10-CM

## 2018-12-30 DIAGNOSIS — E039 Hypothyroidism, unspecified: Secondary | ICD-10-CM

## 2018-12-30 NOTE — Telephone Encounter (Signed)
Sherry Munoz called and left a message stating Nature-Throid is not available. She needs a different thyroid medication. Please advise.

## 2018-12-31 MED ORDER — LEVOTHYROXINE SODIUM 50 MCG PO TABS: 50.0000 ug | ORAL_TABLET | Freq: Every day | ORAL | 0 refills | Status: DC

## 2018-12-31 NOTE — Telephone Encounter (Signed)
Alternative sent, will need labs in 2-3 mos, orders are in

## 2019-01-01 NOTE — Telephone Encounter (Signed)
Patient advised. Will get new med then do repeat labs

## 2019-01-15 DIAGNOSIS — R079 Chest pain, unspecified: Secondary | ICD-10-CM

## 2019-01-15 DIAGNOSIS — J90 Pleural effusion, not elsewhere classified: Secondary | ICD-10-CM

## 2019-02-02 DIAGNOSIS — J90 Pleural effusion, not elsewhere classified: Secondary | ICD-10-CM | POA: Insufficient documentation

## 2019-02-02 NOTE — Telephone Encounter (Signed)
Patient called into Imaging and wanted to go ahead and schedule the CT for 02/16/19, she is off that day.  Can you go ahead and place the order so Fleet Contras can get it authorized.  Thanks so much!!  Toniann Fail

## 2019-02-02 NOTE — Assessment & Plan Note (Signed)
Incidentally noted pleural effusion, we give it over a month, I would like to ensure this effusion has cleared. The CT is not for follow-up of incidentally noted pulmonary nodules which can be followed up in a year.

## 2019-02-02 NOTE — Telephone Encounter (Signed)
CT chest without contrast ordered.

## 2019-02-16 ENCOUNTER — Other Ambulatory Visit: Payer: BLUE CROSS/BLUE SHIELD

## 2019-03-27 ENCOUNTER — Encounter: Payer: Self-pay | Admitting: Osteopathic Medicine

## 2019-03-27 NOTE — Telephone Encounter (Signed)
While I understand the plight of medical costs, this discussion is going to need a visit, virtual or in office, and is outside of the parameters of MyChart messaging. She is welcome to set up an appointment to discuss the risks and benefits of restarting birth control as it differs with each person and their unique health situations.

## 2019-04-15 ENCOUNTER — Other Ambulatory Visit: Payer: Self-pay | Admitting: Osteopathic Medicine

## 2019-04-17 ENCOUNTER — Other Ambulatory Visit: Payer: Self-pay | Admitting: Osteopathic Medicine

## 2019-04-17 LAB — TSH: TSH: 0.83 mIU/L

## 2019-04-17 MED ORDER — LEVOTHYROXINE SODIUM 50 MCG PO TABS: 50.0000 ug | ORAL_TABLET | Freq: Every day | ORAL | 3 refills | Status: DC

## 2019-07-22 DIAGNOSIS — Z01419 Encounter for gynecological examination (general) (routine) without abnormal findings: Secondary | ICD-10-CM | POA: Diagnosis not present

## 2019-07-22 DIAGNOSIS — Z6821 Body mass index (BMI) 21.0-21.9, adult: Secondary | ICD-10-CM | POA: Diagnosis not present

## 2019-07-22 DIAGNOSIS — Z1151 Encounter for screening for human papillomavirus (HPV): Secondary | ICD-10-CM | POA: Diagnosis not present

## 2019-07-22 DIAGNOSIS — R7989 Other specified abnormal findings of blood chemistry: Secondary | ICD-10-CM | POA: Diagnosis not present

## 2019-12-28 ENCOUNTER — Other Ambulatory Visit: Payer: Self-pay | Admitting: Osteopathic Medicine

## 2019-12-28 DIAGNOSIS — F411 Generalized anxiety disorder: Secondary | ICD-10-CM

## 2019-12-28 DIAGNOSIS — L709 Acne, unspecified: Secondary | ICD-10-CM

## 2020-01-11 ENCOUNTER — Other Ambulatory Visit: Payer: Self-pay | Admitting: Osteopathic Medicine

## 2020-01-11 DIAGNOSIS — L709 Acne, unspecified: Secondary | ICD-10-CM

## 2020-02-01 IMAGING — CT CT ANGIO CHEST
2 of 7 series · 18 of 36 positions shown · IV contrast (Omnipaque)
Comparison: No priors.

CLINICAL DATA: 38-year-old female with history of chest pain.
Elevated D-dimer. Evaluate for pulmonary embolism.

EXAM:
CT ANGIOGRAPHY CHEST WITH CONTRAST
TECHNIQUE: Multidetector CT imaging of the chest was performed using the
standard protocol during bolus administration of intravenous
contrast. Multiplanar CT image reconstructions and MIPs were
obtained to evaluate the vascular anatomy.
CONTRAST:  50mL OMNIPAQUE IOHEXOL 350 MG/ML SOLN

[Series 7: pe coronal mpr · coronal · 0.48mm/px · 1 of 116 slices shown]
[im 58/116  mediastinal]
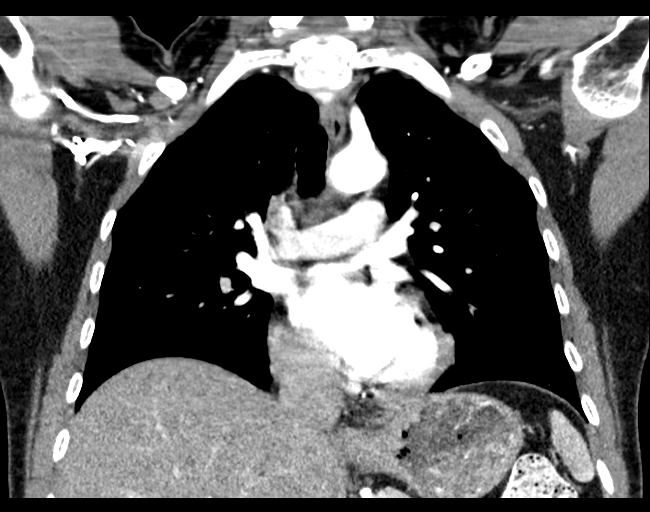

[Series 11: pe thins · axial · 0.61mm/px · z∈[-219,-13]mm · 17 of 230 slices shown]
[im 12/230  lung]
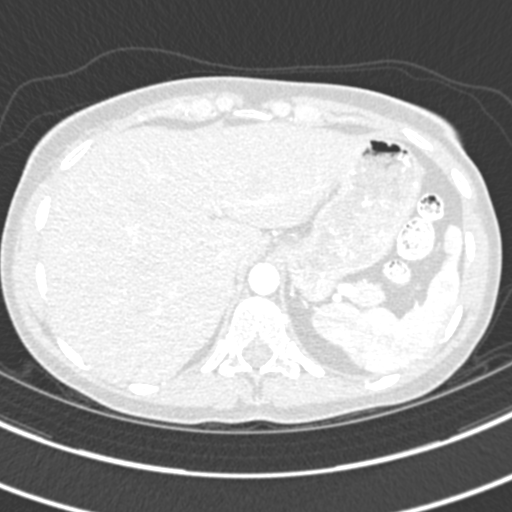
[im 23/230  mediastinal]
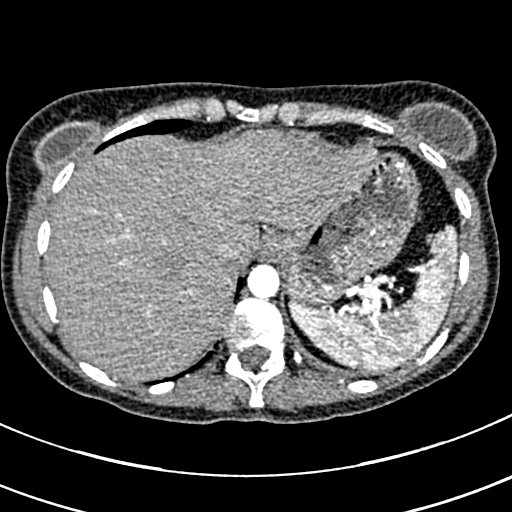
[im 35/230  lung]
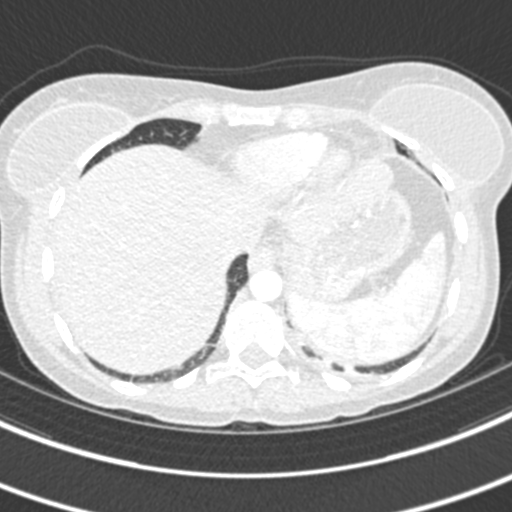
[im 46/230  mediastinal]
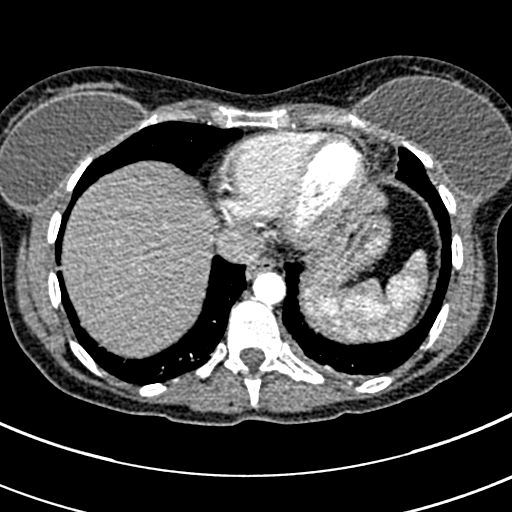
[im 69/230  lung]
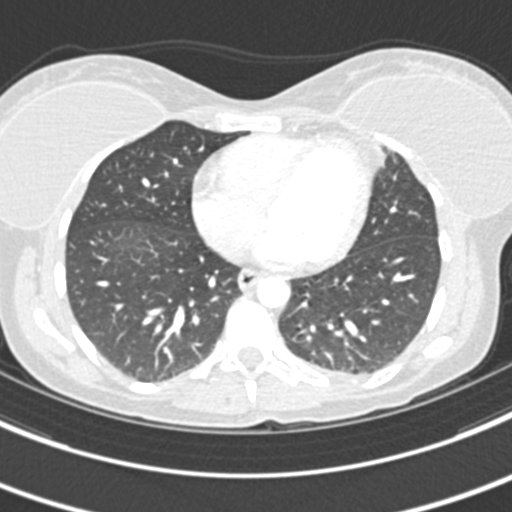
[im 81/230  mediastinal]
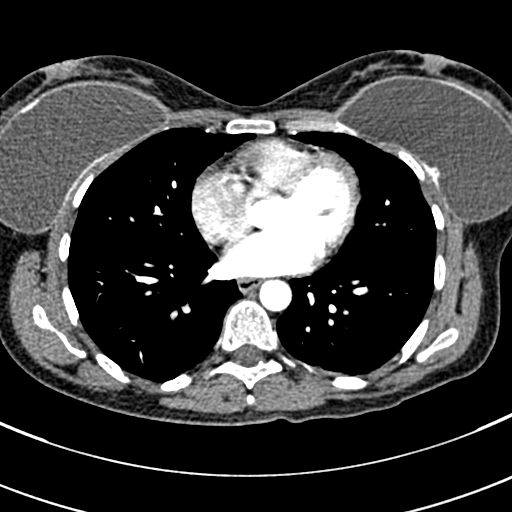
[im 92/230  lung]
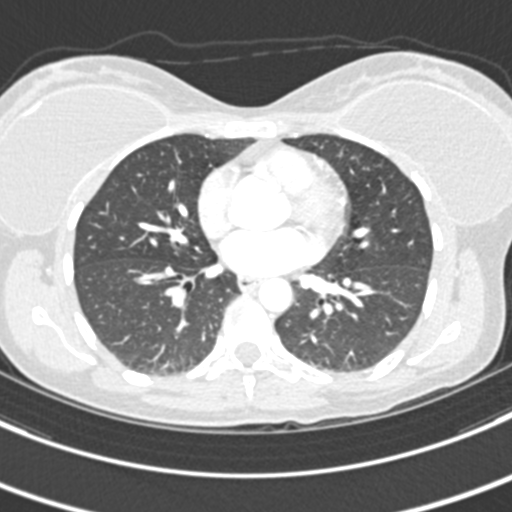
[im 104/230  mediastinal]
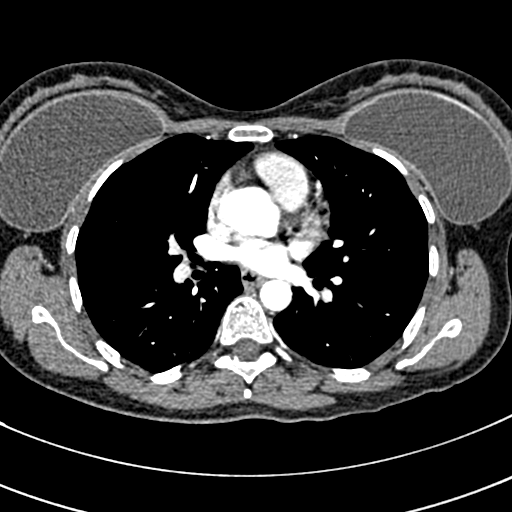
[im 115/230  lung]
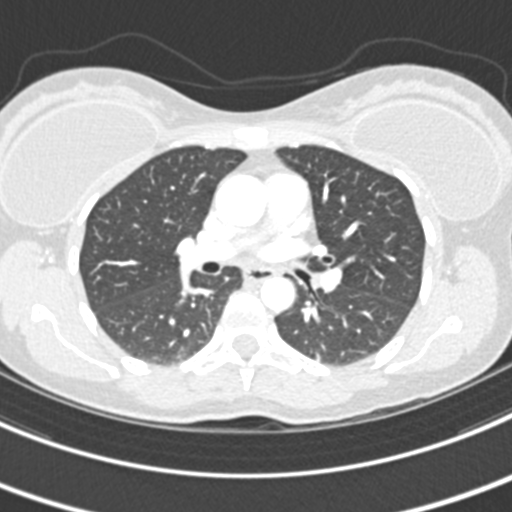
[im 126/230  mediastinal]
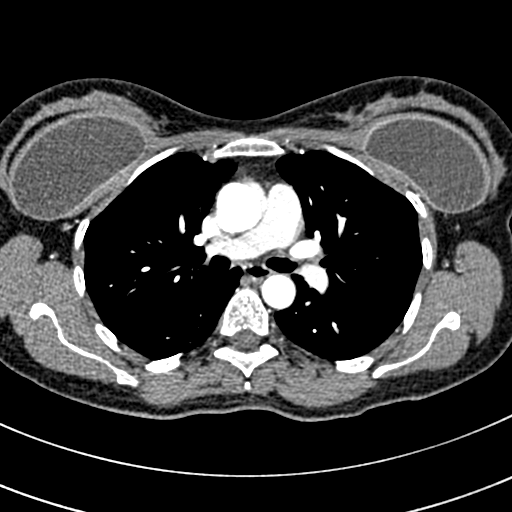
[im 138/230  lung]
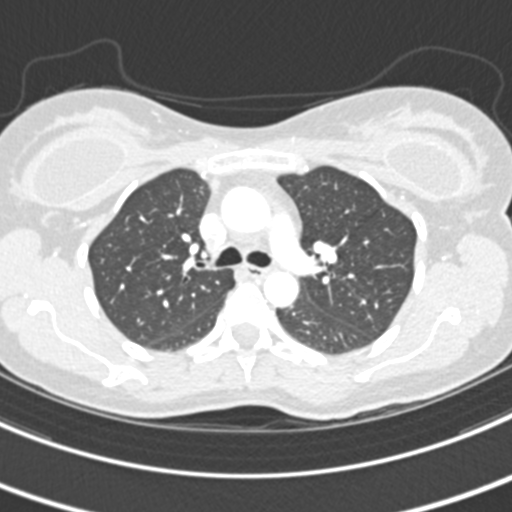
[im 149/230  mediastinal]
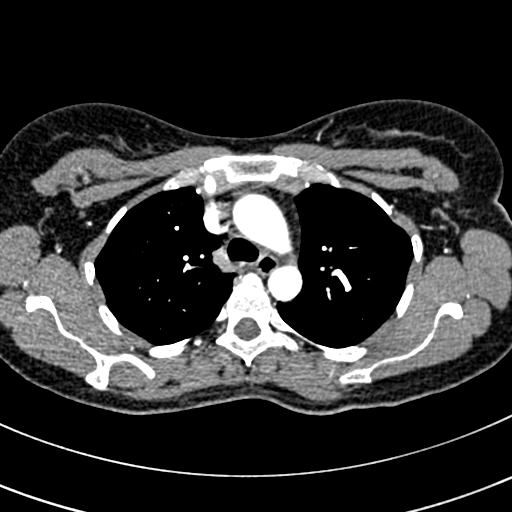
[im 161/230  lung]
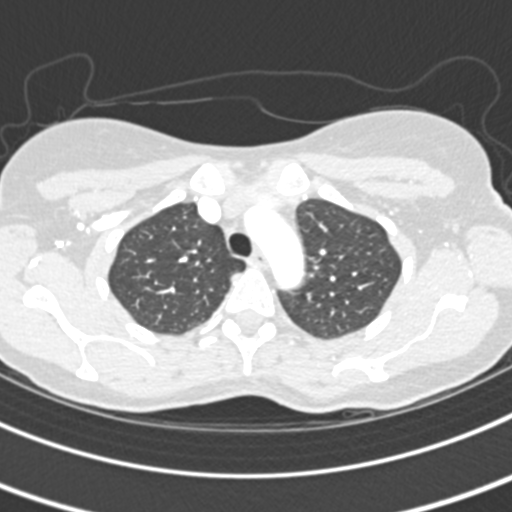
[im 184/230  mediastinal]
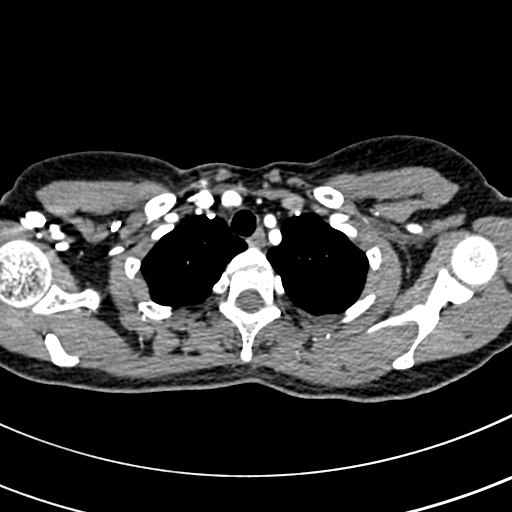
[im 195/230  lung]
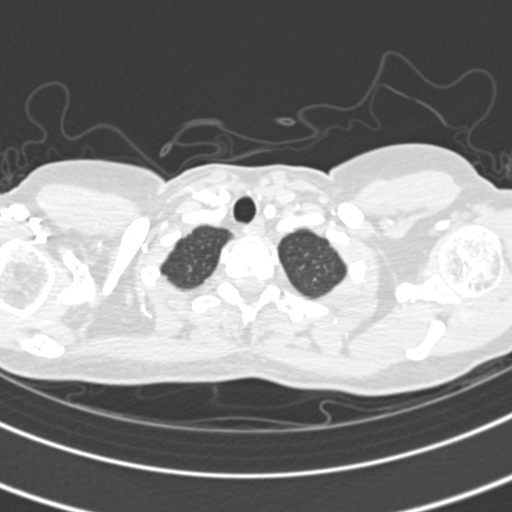
[im 207/230  mediastinal]
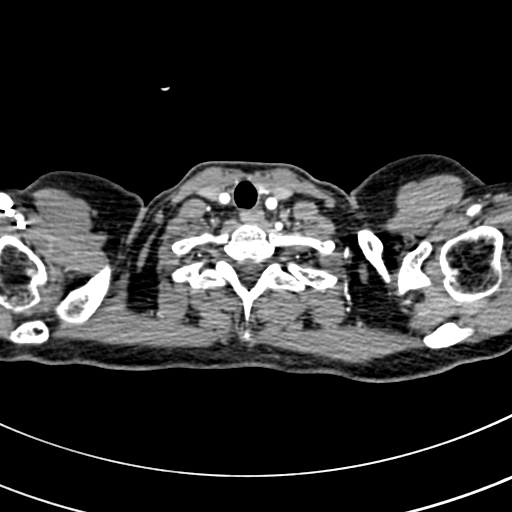
[im 218/230  lung]
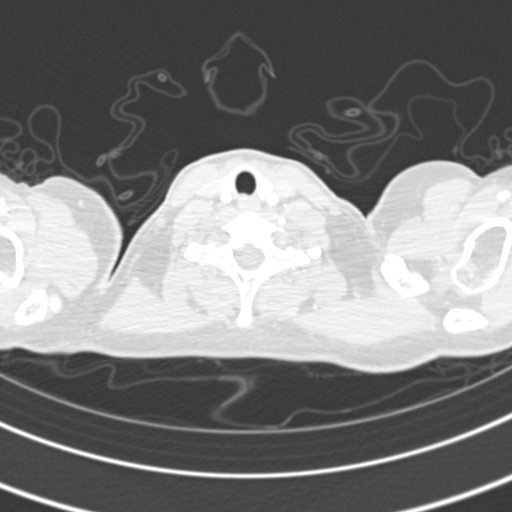

[18 of 36 positions shown; findings below may reference images not displayed]

FINDINGS: Comment: Today's study is limited for evaluation of pulmonary
embolism by suboptimal contrast bolus which is predominantly
arterial phase rather than pulmonary arterial phase.

Cardiovascular: Within the limitation of today's examination there
is no central, lobar or segmental filling defect to suggest
clinically relevant pulmonary embolism. Smaller subsegmental size
filling defects cannot be completely excluded. Heart size is normal.
There is no significant pericardial fluid, thickening or pericardial
calcification. No atherosclerotic calcifications in the thoracic
aorta or the coronary arteries.

Mediastinum/Nodes: No pathologically enlarged mediastinal or hilar
lymph nodes. Esophagus is unremarkable in appearance. No axillary
lymphadenopathy.

Lungs/Pleura: Several tiny pulmonary nodules are noted in the lungs
bilaterally, largest of which measures only 5 mm in the superior
segment of the right lower lobe (axial image 44 of series 6). No
other larger more suspicious appearing pulmonary nodules or masses
are noted. No acute consolidative airspace disease. Trace left
pleural effusion lying dependently. No right pleural effusion.

Upper Abdomen: Unremarkable.

Musculoskeletal: Bilateral breast implants are incidentally noted.
There are no aggressive appearing lytic or blastic lesions noted in
the visualized portions of the skeleton.

Review of the MIP images confirms the above findings.
IMPRESSION: 1. Limited study demonstrating no evidence of clinically relevant
central, lobar or segmental sized pulmonary embolism.
2. Trace left pleural effusion lying dependently.
3. Multiple small pulmonary nodules measuring 5 mm or less in size,
nonspecific, but statistically likely benign. No follow-up needed if
patient is low-risk (and has no known or suspected primary
neoplasm). Non-contrast chest CT can be considered in 12 months if
patient is high-risk. This recommendation follows the consensus
statement: Guidelines for Management of Incidental Pulmonary Nodules
Detected on CT Images: From the [HOSPITAL] 4520; Radiology

## 2020-04-24 ENCOUNTER — Other Ambulatory Visit: Payer: Self-pay | Admitting: Osteopathic Medicine

## 2020-04-24 DIAGNOSIS — L709 Acne, unspecified: Secondary | ICD-10-CM

## 2020-06-01 ENCOUNTER — Ambulatory Visit: Payer: BLUE CROSS/BLUE SHIELD | Admitting: Medical-Surgical

## 2020-06-17 ENCOUNTER — Other Ambulatory Visit: Payer: Self-pay | Admitting: Osteopathic Medicine

## 2020-09-27 ENCOUNTER — Other Ambulatory Visit: Payer: Self-pay | Admitting: Osteopathic Medicine

## 2020-11-29 ENCOUNTER — Other Ambulatory Visit: Payer: Self-pay | Admitting: Sports Medicine

## 2020-12-15 DIAGNOSIS — O09521 Supervision of elderly multigravida, first trimester: Secondary | ICD-10-CM | POA: Diagnosis not present

## 2021-01-05 DIAGNOSIS — E039 Hypothyroidism, unspecified: Secondary | ICD-10-CM | POA: Diagnosis not present

## 2021-01-05 DIAGNOSIS — Z369 Encounter for antenatal screening, unspecified: Secondary | ICD-10-CM | POA: Diagnosis not present

## 2021-01-19 DIAGNOSIS — Z8269 Family history of other diseases of the musculoskeletal system and connective tissue: Secondary | ICD-10-CM | POA: Diagnosis not present

## 2021-01-19 DIAGNOSIS — E039 Hypothyroidism, unspecified: Secondary | ICD-10-CM | POA: Diagnosis not present

## 2021-01-19 DIAGNOSIS — Z3A17 17 weeks gestation of pregnancy: Secondary | ICD-10-CM | POA: Diagnosis not present

## 2021-01-19 DIAGNOSIS — Z8349 Family history of other endocrine, nutritional and metabolic diseases: Secondary | ICD-10-CM | POA: Diagnosis not present

## 2021-01-31 DIAGNOSIS — Z3A18 18 weeks gestation of pregnancy: Secondary | ICD-10-CM | POA: Diagnosis not present

## 2021-01-31 DIAGNOSIS — O358XX0 Maternal care for other (suspected) fetal abnormality and damage, not applicable or unspecified: Secondary | ICD-10-CM | POA: Diagnosis not present

## 2021-01-31 DIAGNOSIS — E039 Hypothyroidism, unspecified: Secondary | ICD-10-CM | POA: Diagnosis not present

## 2021-01-31 DIAGNOSIS — O99282 Endocrine, nutritional and metabolic diseases complicating pregnancy, second trimester: Secondary | ICD-10-CM | POA: Diagnosis not present

## 2021-02-13 DIAGNOSIS — E039 Hypothyroidism, unspecified: Secondary | ICD-10-CM | POA: Diagnosis not present

## 2021-02-14 DIAGNOSIS — M62830 Muscle spasm of back: Secondary | ICD-10-CM | POA: Diagnosis not present

## 2021-02-14 DIAGNOSIS — M5414 Radiculopathy, thoracic region: Secondary | ICD-10-CM | POA: Diagnosis not present

## 2021-02-14 DIAGNOSIS — M546 Pain in thoracic spine: Secondary | ICD-10-CM | POA: Diagnosis not present

## 2021-02-14 DIAGNOSIS — M461 Sacroiliitis, not elsewhere classified: Secondary | ICD-10-CM | POA: Diagnosis not present

## 2021-02-15 DIAGNOSIS — M546 Pain in thoracic spine: Secondary | ICD-10-CM | POA: Diagnosis not present

## 2021-02-15 DIAGNOSIS — M5414 Radiculopathy, thoracic region: Secondary | ICD-10-CM | POA: Diagnosis not present

## 2021-02-15 DIAGNOSIS — M62830 Muscle spasm of back: Secondary | ICD-10-CM | POA: Diagnosis not present

## 2021-02-15 DIAGNOSIS — M461 Sacroiliitis, not elsewhere classified: Secondary | ICD-10-CM | POA: Diagnosis not present

## 2021-02-16 DIAGNOSIS — R300 Dysuria: Secondary | ICD-10-CM | POA: Diagnosis not present

## 2021-02-17 DIAGNOSIS — M546 Pain in thoracic spine: Secondary | ICD-10-CM | POA: Diagnosis not present

## 2021-02-17 DIAGNOSIS — M461 Sacroiliitis, not elsewhere classified: Secondary | ICD-10-CM | POA: Diagnosis not present

## 2021-02-17 DIAGNOSIS — M5414 Radiculopathy, thoracic region: Secondary | ICD-10-CM | POA: Diagnosis not present

## 2021-02-17 DIAGNOSIS — M62830 Muscle spasm of back: Secondary | ICD-10-CM | POA: Diagnosis not present
